# Patient Record
Sex: Male | Born: 1950
Health system: Southern US, Community
[De-identification: ages and names within clinical notes are randomized; demographics above are authoritative.]

## PROBLEM LIST (undated history)

## (undated) DIAGNOSIS — M25561 Pain in right knee: Secondary | ICD-10-CM

## (undated) DIAGNOSIS — H43812 Vitreous degeneration, left eye: Secondary | ICD-10-CM

## (undated) DIAGNOSIS — I499 Cardiac arrhythmia, unspecified: Secondary | ICD-10-CM

## (undated) DIAGNOSIS — L989 Disorder of the skin and subcutaneous tissue, unspecified: Secondary | ICD-10-CM

## (undated) DIAGNOSIS — L602 Onychogryphosis: Secondary | ICD-10-CM

## (undated) DIAGNOSIS — I493 Ventricular premature depolarization: Secondary | ICD-10-CM

## (undated) DIAGNOSIS — H353124 Nonexudative age-related macular degeneration, left eye, advanced atrophic with subfoveal involvement: Secondary | ICD-10-CM

## (undated) DIAGNOSIS — R451 Restlessness and agitation: Secondary | ICD-10-CM

## (undated) DIAGNOSIS — R634 Abnormal weight loss: Secondary | ICD-10-CM

## (undated) HISTORY — PX: DENTAL SURGERY: SHX609

## (undated) HISTORY — DX: Vitreous degeneration, left eye: H43.812

## (undated) HISTORY — PX: FRACTURE SURGERY: SHX138

## (undated) HISTORY — PX: VASECTOMY: SHX75

## (undated) HISTORY — DX: Pain in right knee: M25.561

## (undated) HISTORY — DX: Ventricular premature depolarization: I49.3

## (undated) HISTORY — DX: Cardiac arrhythmia, unspecified: I49.9

## (undated) HISTORY — DX: Disorder of the skin and subcutaneous tissue, unspecified: L98.9

## (undated) HISTORY — DX: Abnormal weight loss: R63.4

## (undated) HISTORY — PX: APPENDECTOMY: SHX54

## (undated) HISTORY — DX: Nonexudative age-related macular degeneration, left eye, advanced atrophic with subfoveal involvement: H35.3124

## (undated) HISTORY — DX: Onychogryphosis: L60.2

## (undated) HISTORY — DX: Restlessness and agitation: R45.1

## (undated) HISTORY — PX: EYE SURGERY: SHX253

## (undated) HISTORY — PX: OTHER SURGICAL HISTORY: SHX169

---

## 2019-03-26 ENCOUNTER — Ambulatory Visit (INDEPENDENT_AMBULATORY_CARE_PROVIDER_SITE_OTHER): Payer: PPO

## 2019-03-26 ENCOUNTER — Encounter (HOSPITAL_COMMUNITY): Payer: Self-pay

## 2019-03-26 ENCOUNTER — Ambulatory Visit (HOSPITAL_COMMUNITY)
Admission: EM | Admit: 2019-03-26 | Discharge: 2019-03-26 | Disposition: A | Discharge status: Home / Self Care | Payer: PPO | Attending: Emergency Medicine | Admitting: Emergency Medicine

## 2019-03-26 ENCOUNTER — Other Ambulatory Visit: Payer: Self-pay

## 2019-03-26 ENCOUNTER — Ambulatory Visit (HOSPITAL_COMMUNITY): Payer: PPO

## 2019-03-26 DIAGNOSIS — W319XXA Contact with unspecified machinery, initial encounter: Secondary | ICD-10-CM | POA: Diagnosis not present

## 2019-03-26 DIAGNOSIS — S92421B Displaced fracture of distal phalanx of right great toe, initial encounter for open fracture: Secondary | ICD-10-CM

## 2019-03-26 DIAGNOSIS — Z23 Encounter for immunization: Secondary | ICD-10-CM

## 2019-03-26 DIAGNOSIS — S92422A Displaced fracture of distal phalanx of left great toe, initial encounter for closed fracture: Secondary | ICD-10-CM | POA: Diagnosis not present

## 2019-03-26 DIAGNOSIS — T148XXA Other injury of unspecified body region, initial encounter: Secondary | ICD-10-CM

## 2019-03-26 MED ORDER — CEPHALEXIN 500 MG PO CAPS: 500.0000 mg | ORAL_CAPSULE | Freq: Four times a day (QID) | ORAL | 0 refills | Status: AC

## 2019-03-26 MED ORDER — TETANUS-DIPHTH-ACELL PERTUSSIS 5-2.5-18.5 LF-MCG/0.5 IM SUSP
0.5000 mL | Freq: Once | INTRAMUSCULAR | Status: AC
  Administered 2019-03-26: 0.5 mL via INTRAMUSCULAR

## 2019-03-26 MED ORDER — TETANUS-DIPHTH-ACELL PERTUSSIS 5-2.5-18.5 LF-MCG/0.5 IM SUSP
INTRAMUSCULAR | Status: AC
  Filled 2019-03-26: qty 0.5

## 2019-03-26 NOTE — ED Provider Notes (Addendum)
[facility]    CSN: [id number] Arrival date & time: 03/26/19  1438     History   Chief Complaint Chief Complaint  Patient presents with  . Toe Pain    HPI Julio AlmJohn E Hynson is a 68 y.o. male no significant past medical history presenting today for evaluation of left toe injury.  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  He was wearing sneakers.  Had some mild initial pain, but this went away quickly.  He continued to work for another 3 to 4 hours.  After this and he took off his shoe he noticed he was bleeding a lot.  He has not had much pain.  Patient denies history of diabetes.  Denies use of any blood thinners. HPI  History reviewed. No pertinent past medical history.  There are no active problems to display for this patient.   History reviewed. No pertinent surgical history.     Home Medications    Prior to Admission medications   Medication Sig Start Date End Date Taking? Authorizing Provider  cephALEXin (KEFLEX) 500 MG capsule Take 1 capsule (500 mg total) by mouth 4 (four) times daily for 7 days. 03/26/19 04/02/19  Mariano Doshi, Junius CreamerHallie C, PA-C    Family History History reviewed. No pertinent family history.  Social History Social History   Tobacco Use  . Smoking status: Former Games developermoker  . Smokeless tobacco: Current User  Substance Use Topics  . Alcohol use: Never    Frequency: Never  . Drug use: Yes     Allergies   Patient has no known allergies.   Review of Systems Review of Systems  Constitutional: Negative for fatigue and fever.  Eyes: Negative for redness, itching and visual disturbance.  Respiratory: Negative for shortness of breath.   Cardiovascular: Negative for chest pain and leg swelling.  Gastrointestinal: Negative for nausea and vomiting.  Musculoskeletal: Positive for joint swelling. Negative for arthralgias and myalgias.  Skin: Positive  for color change and wound. Negative for rash.  Neurological: Negative for dizziness, syncope, weakness, light-headedness and headaches.     Physical Exam Triage Vital Signs ED Triage Vitals  Enc Vitals Group     BP 03/26/19 1615 125/67     Pulse Rate 03/26/19 1615 69     Resp 03/26/19 1615 18     Temp 03/26/19 1615 98.6 F (37 C)     Temp Source 03/26/19 1615 Oral     SpO2 03/26/19 1615 97 %     Weight 03/26/19 1617 225 lb (102.1 kg)     Height --      Head Circumference --      Peak Flow --      Pain Score 03/26/19 1617 1     Pain Loc --      Pain Edu? --      Excl. in GC? --    No data found.  Updated Vital Signs BP 125/67 (BP Location: Right Arm)   Pulse 69   Temp 98.6 F (37 C) (Oral)   Resp 18   Wt 225 lb (102.1 kg)   SpO2 97%   Visual Acuity Right Eye Distance:   Left Eye Distance:   Bilateral Distance:    Right Eye Near:   Left Eye Near:    Bilateral Near:     Physical Exam Vitals signs and nursing note reviewed.  Constitutional:  Appearance: He is well-developed.     Comments: No acute distress; well appearing  HENT:     Head: Normocephalic and atraumatic.     Nose: Nose normal.  Eyes:     Conjunctiva/sclera: Conjunctivae normal.  Neck:     Musculoskeletal: Neck supple.  Cardiovascular:     Rate and Rhythm: Normal rate.  Pulmonary:     Effort: Pulmonary effort is normal. No respiratory distress.  Abdominal:     General: There is no distension.  Musculoskeletal: Normal range of motion.     Comments: Dorsalis pedis 2+, nontender throughout dorsum of foot Ambulating without abnormality  Skin:    General: Skin is warm and dry.     Comments: See pictures below; large area of clotted/soft tissue swelling about distal great left toe, large nail completely removed, but is still attached to some soft tissue below.  The more distal wound edge appears to extend below nailbed slightly  Neurological:     Mental Status: He is alert and oriented  to person, place, and time.          UC Treatments / Results  Labs (all labs ordered are listed, but only abnormal results are displayed) Labs Reviewed - No data to display  EKG   Radiology Dg Foot Complete Left  Result Date: 03/26/2019 CLINICAL DATA:  Trauma to great toe. EXAM: LEFT FOOT - COMPLETE 3+ VIEW COMPARISON:  None. FINDINGS: There is a comminuted displaced fracture through the distal first phalanx. Linear high attenuation along the dorsum of the great toe is only seen on the lateral view. The lack of visualization on the AP or oblique views would suggest this is not a foreign body. Recommend clinical correlation. IMPRESSION: 1. Comminuted displaced fracture through the distal first phalanx. 2. Curvilinear high attenuation along the dorsum of the distal phalanx on only the lateral view is probably a dense bony fragment given the lack of visualization on the other views. A foreign body is considered less likely given lack of visualization on other views. Electronically Signed   By: Dorise Bullion III M.D   On: 03/26/2019 16:47    Procedures Procedures (including critical care time)  Medications Ordered in UC Medications  Tdap (BOOSTRIX) injection 0.5 mL (0.5 mLs Intramuscular Given 03/26/19 1749)  Tdap (BOOSTRIX) 5-2.5-18.5 LF-MCG/0.5 injection (has no administration in time range)  Tdap (BOOSTRIX) 5-2.5-18.5 LF-MCG/0.5 injection (has no administration in time range)  Tdap (BOOSTRIX) 5-2.5-18.5 LF-MCG/0.5 injection (has no administration in time range)    Initial Impression / Assessment and Plan / UC Course  I have reviewed the triage vital signs and the nursing notes.  Pertinent labs & imaging results that were available during my care of the patient were reviewed by me and considered in my medical decision making (see chart for details).     Comminuted fracture of distal first phalanx of great toe on left foot.  Likely open fracture given associated superficial  injury. Discussed fracture with Dr. Murphy-recommended outpatient follow-up, irrigate well, prophylactic antibiotics, advised to keep toe nail intact.  Initially believed to be clot, but appears to have significant soft tissue swelling.  Wound irrigated with 1000 mL of sterile water and syringe.  Applied Xeroform gauze followed by Kerlix and Coban.  Given complexity of fracture, recommended patient to use crutches, patient declined.  Advised to keep foot elevated, stay off foot as much as possible and avoid closed toe shoes.  Tetanus updated today.  Placed on Keflex.  Provided contact for follow-up  with Delbert HarnessMurphy Wainer.Discussed strict return precautions. Patient verbalized understanding and is agreeable with plan.  Final Clinical Impressions(s) / UC Diagnoses   Final diagnoses:  Comminuted fracture  Open displaced fracture of distal phalanx of right great toe, initial encounter     Discharge Instructions     Please follow up with Dr Eulah Pontmurphy this week, call Monday morning We updated your tetanus Please begin keflex 4 times daily for 5 days    ED Prescriptions    Medication Sig Dispense Auth. Provider   cephALEXin (KEFLEX) 500 MG capsule Take 1 capsule (500 mg total) by mouth 4 (four) times daily for 7 days. 28 capsule Chrisotpher Rivero C, PA-C     Controlled Substance Prescriptions [place] Controlled Substance Registry consulted? Not Applicable   Lew DawesWieters, Jomari Bartnik C, PA-C 03/27/19 0920    Lew DawesWieters, Harry Shuck C, PA-C 03/27/19 [phone]

## 2019-03-26 NOTE — ED Triage Notes (Signed)
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 This happened this morning.

## 2019-03-26 NOTE — Discharge Instructions (Signed)
Please follow up with Dr Percell Miller this week, call Monday morning We updated your tetanus Please begin keflex 4 times daily for 5 days

## 2020-03-26 DIAGNOSIS — H33301 Unspecified retinal break, right eye: Secondary | ICD-10-CM | POA: Diagnosis not present

## 2020-03-26 DIAGNOSIS — H2513 Age-related nuclear cataract, bilateral: Secondary | ICD-10-CM | POA: Diagnosis not present

## 2020-03-26 DIAGNOSIS — H353213 Exudative age-related macular degeneration, right eye, with inactive scar: Secondary | ICD-10-CM | POA: Diagnosis not present

## 2020-03-26 DIAGNOSIS — H52203 Unspecified astigmatism, bilateral: Secondary | ICD-10-CM | POA: Diagnosis not present

## 2020-04-06 DIAGNOSIS — H43811 Vitreous degeneration, right eye: Secondary | ICD-10-CM | POA: Diagnosis not present

## 2020-04-06 DIAGNOSIS — H353123 Nonexudative age-related macular degeneration, left eye, advanced atrophic without subfoveal involvement: Secondary | ICD-10-CM | POA: Diagnosis not present

## 2020-04-06 DIAGNOSIS — H353213 Exudative age-related macular degeneration, right eye, with inactive scar: Secondary | ICD-10-CM | POA: Diagnosis not present

## 2020-04-06 DIAGNOSIS — H59811 Chorioretinal scars after surgery for detachment, right eye: Secondary | ICD-10-CM | POA: Diagnosis not present

## 2020-10-16 DIAGNOSIS — H43391 Other vitreous opacities, right eye: Secondary | ICD-10-CM | POA: Diagnosis not present

## 2020-10-16 DIAGNOSIS — H353124 Nonexudative age-related macular degeneration, left eye, advanced atrophic with subfoveal involvement: Secondary | ICD-10-CM | POA: Diagnosis not present

## 2020-10-16 DIAGNOSIS — H43811 Vitreous degeneration, right eye: Secondary | ICD-10-CM | POA: Diagnosis not present

## 2020-10-16 DIAGNOSIS — H353213 Exudative age-related macular degeneration, right eye, with inactive scar: Secondary | ICD-10-CM | POA: Diagnosis not present

## 2021-02-05 DIAGNOSIS — H353124 Nonexudative age-related macular degeneration, left eye, advanced atrophic with subfoveal involvement: Secondary | ICD-10-CM | POA: Diagnosis not present

## 2021-02-05 DIAGNOSIS — H353213 Exudative age-related macular degeneration, right eye, with inactive scar: Secondary | ICD-10-CM | POA: Diagnosis not present

## 2021-03-07 IMAGING — DX LEFT FOOT - COMPLETE 3+ VIEW
3 series · 3 of 3 positions shown · non-contrast
Comparison: None.

CLINICAL DATA: Trauma to great toe.

EXAM:
LEFT FOOT - COMPLETE 3+ VIEW

[foot ap]
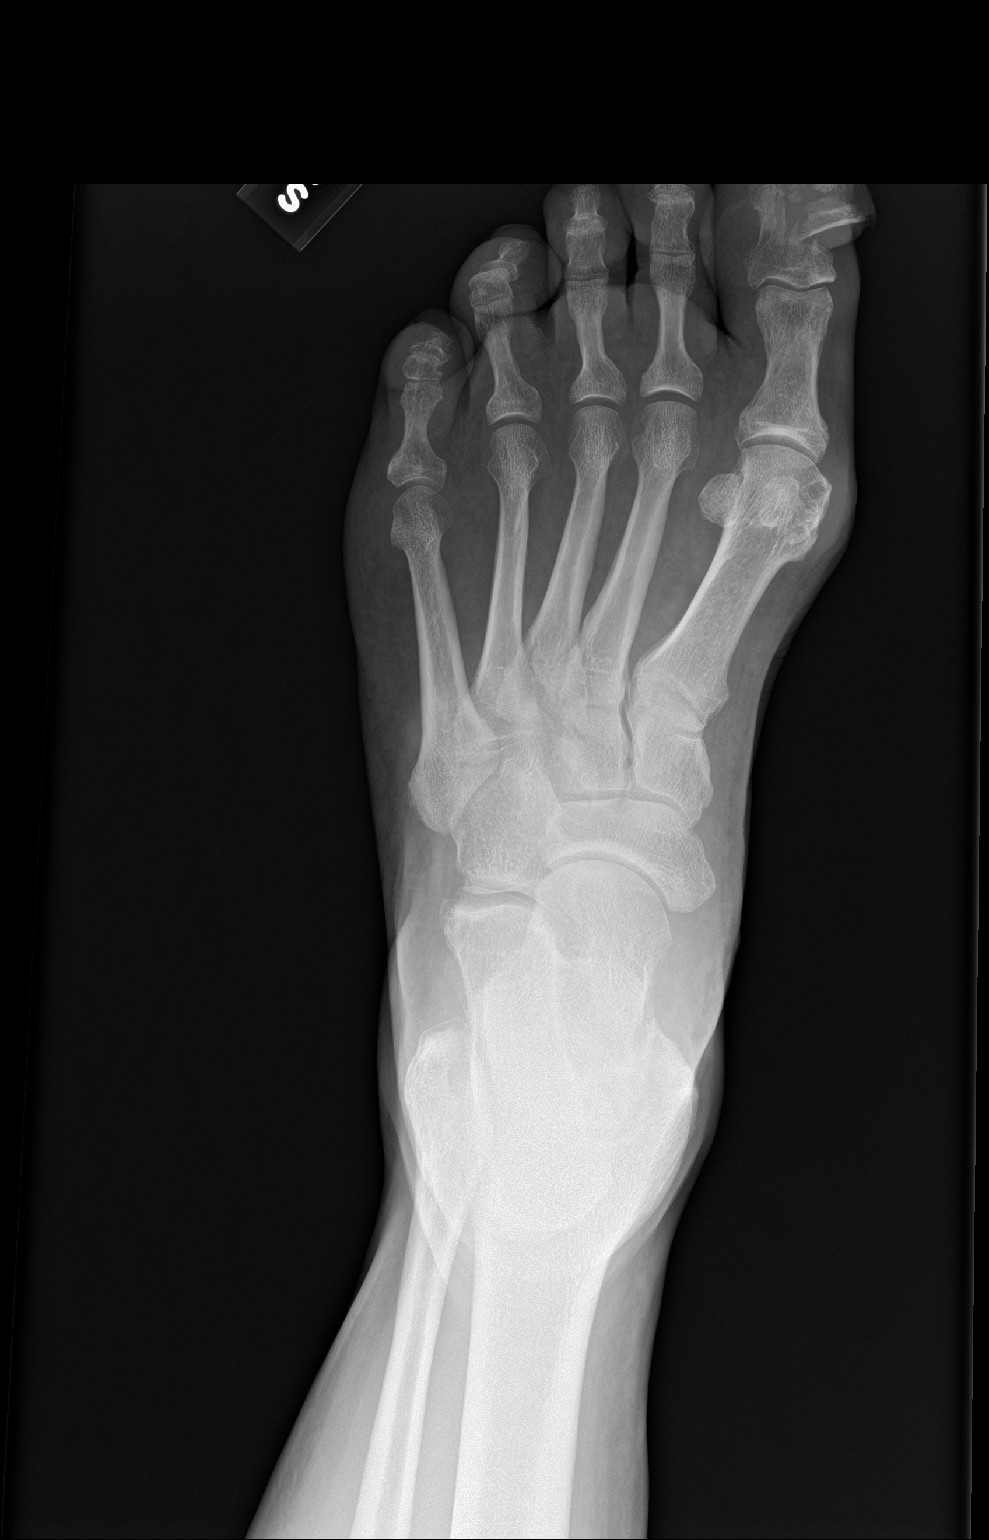

[foot obl]
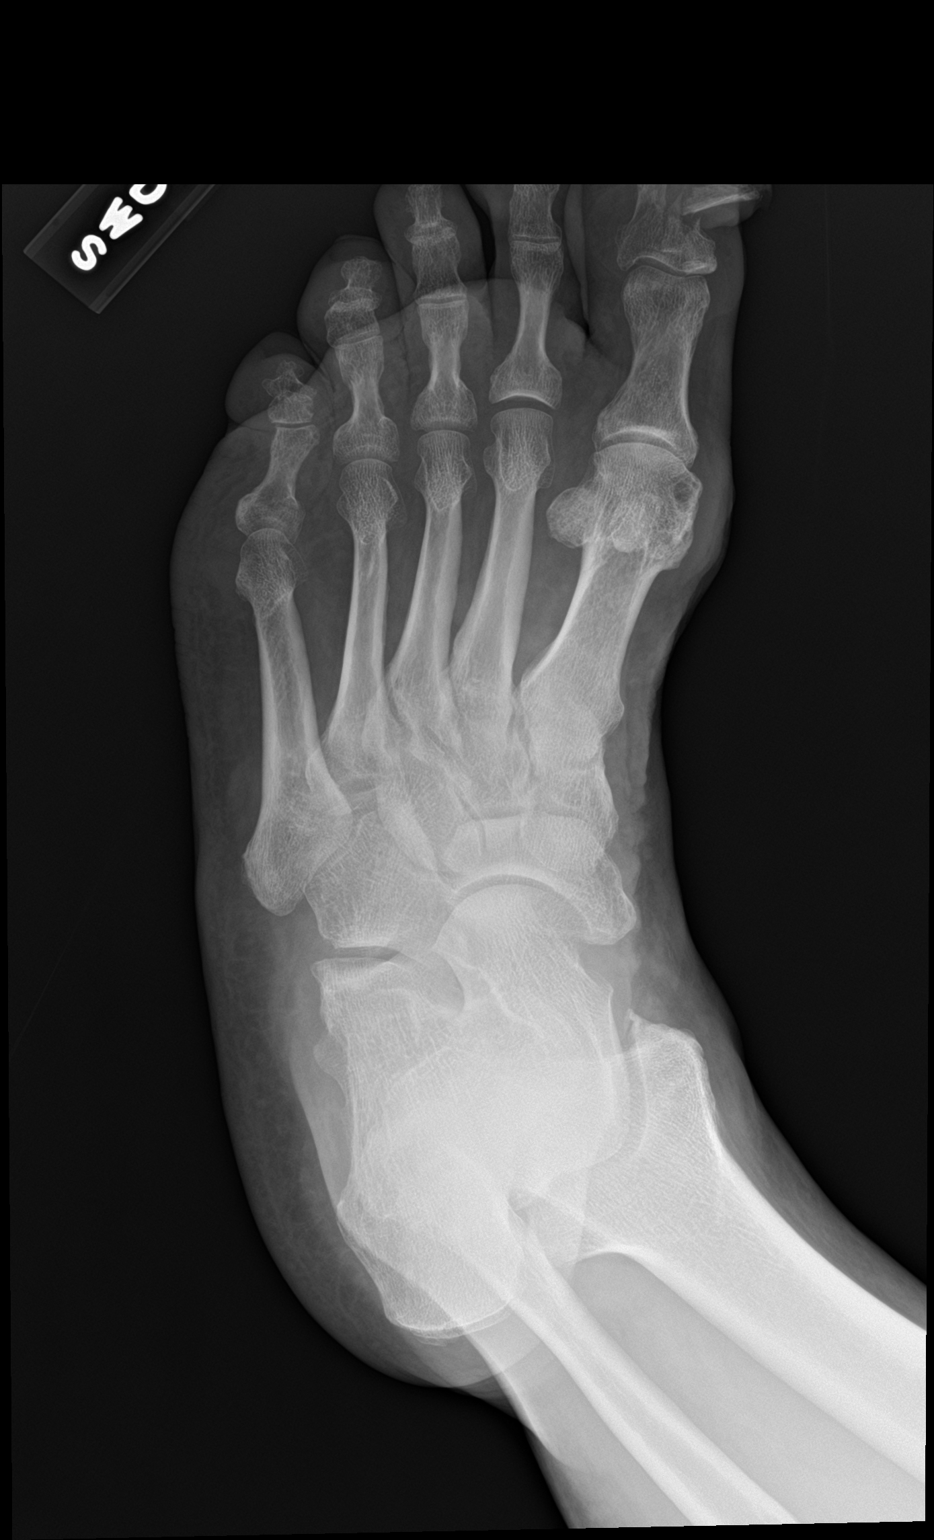

[foot lat]
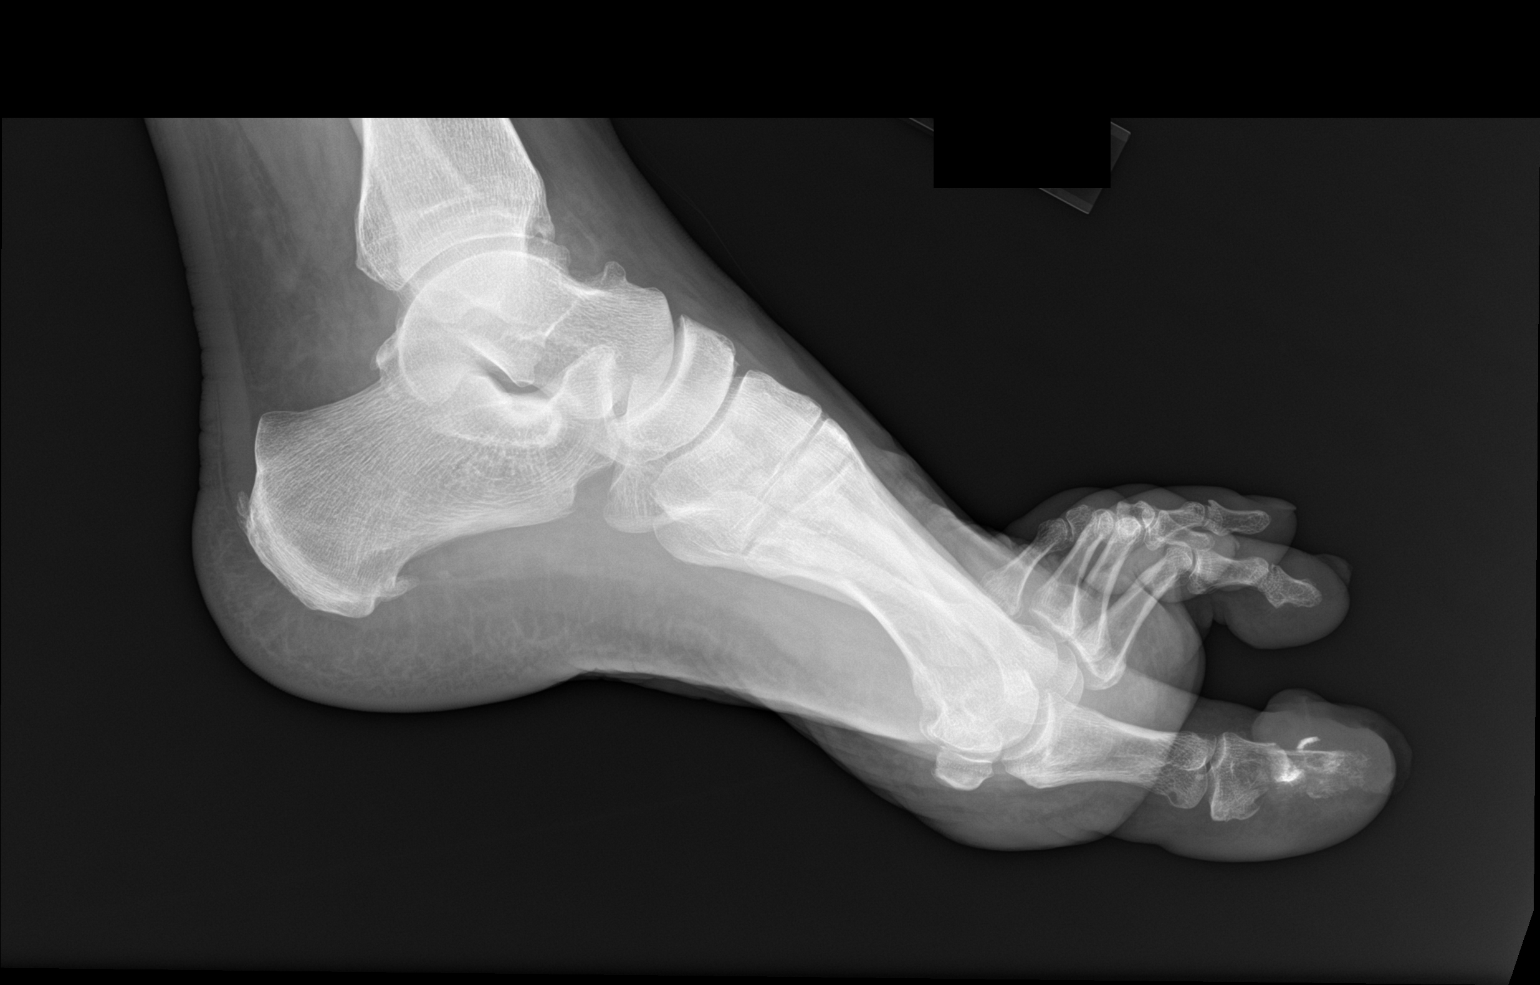

[3 of 3 positions shown; findings below may reference images not displayed]

FINDINGS: There is a comminuted displaced fracture through the distal first
phalanx. Linear high attenuation along the dorsum of the great toe
is only seen on the lateral view. The lack of visualization on the
AP or oblique views would suggest this is not a foreign body.
Recommend clinical correlation.
IMPRESSION: 1. Comminuted displaced fracture through the distal first phalanx.
2. Curvilinear high attenuation along the dorsum of the distal
phalanx on only the lateral view is probably a dense bony fragment
given the lack of visualization on the other views. A foreign body
is considered less likely given lack of visualization on other
views.

## 2021-05-07 DIAGNOSIS — H43812 Vitreous degeneration, left eye: Secondary | ICD-10-CM | POA: Diagnosis not present

## 2021-05-07 DIAGNOSIS — H353213 Exudative age-related macular degeneration, right eye, with inactive scar: Secondary | ICD-10-CM | POA: Diagnosis not present

## 2021-05-07 DIAGNOSIS — H353124 Nonexudative age-related macular degeneration, left eye, advanced atrophic with subfoveal involvement: Secondary | ICD-10-CM | POA: Diagnosis not present

## 2023-05-25 ENCOUNTER — Ambulatory Visit: Payer: PPO | Admitting: Nurse Practitioner

## 2023-05-26 ENCOUNTER — Encounter: Payer: Self-pay | Admitting: Family

## 2023-05-26 ENCOUNTER — Ambulatory Visit (INDEPENDENT_AMBULATORY_CARE_PROVIDER_SITE_OTHER): Payer: PPO | Admitting: Family

## 2023-05-26 ENCOUNTER — Telehealth: Payer: Self-pay | Admitting: Family

## 2023-05-26 VITALS — BP 118/68 | HR 66 | Temp 97.7°F | Ht 71.0 in | Wt 193.6 lb

## 2023-05-26 DIAGNOSIS — X509XXA Other and unspecified overexertion or strenuous movements or postures, initial encounter: Secondary | ICD-10-CM

## 2023-05-26 DIAGNOSIS — R634 Abnormal weight loss: Secondary | ICD-10-CM | POA: Diagnosis not present

## 2023-05-26 DIAGNOSIS — Z23 Encounter for immunization: Secondary | ICD-10-CM | POA: Diagnosis not present

## 2023-05-26 DIAGNOSIS — Z1211 Encounter for screening for malignant neoplasm of colon: Secondary | ICD-10-CM | POA: Diagnosis not present

## 2023-05-26 DIAGNOSIS — H353213 Exudative age-related macular degeneration, right eye, with inactive scar: Secondary | ICD-10-CM | POA: Diagnosis not present

## 2023-05-26 DIAGNOSIS — Z720 Tobacco use: Secondary | ICD-10-CM

## 2023-05-26 DIAGNOSIS — I493 Ventricular premature depolarization: Secondary | ICD-10-CM

## 2023-05-26 DIAGNOSIS — H353124 Nonexudative age-related macular degeneration, left eye, advanced atrophic with subfoveal involvement: Secondary | ICD-10-CM | POA: Diagnosis not present

## 2023-05-26 DIAGNOSIS — Z136 Encounter for screening for cardiovascular disorders: Secondary | ICD-10-CM | POA: Diagnosis not present

## 2023-05-26 DIAGNOSIS — Z0000 Encounter for general adult medical examination without abnormal findings: Secondary | ICD-10-CM | POA: Diagnosis not present

## 2023-05-26 DIAGNOSIS — H43812 Vitreous degeneration, left eye: Secondary | ICD-10-CM | POA: Diagnosis not present

## 2023-05-26 DIAGNOSIS — Z0001 Encounter for general adult medical examination with abnormal findings: Secondary | ICD-10-CM

## 2023-05-26 DIAGNOSIS — I499 Cardiac arrhythmia, unspecified: Secondary | ICD-10-CM | POA: Diagnosis not present

## 2023-05-26 DIAGNOSIS — Z1231 Encounter for screening mammogram for malignant neoplasm of breast: Secondary | ICD-10-CM

## 2023-05-26 DIAGNOSIS — R5383 Other fatigue: Secondary | ICD-10-CM | POA: Diagnosis not present

## 2023-05-26 DIAGNOSIS — R451 Restlessness and agitation: Secondary | ICD-10-CM

## 2023-05-26 LAB — LIPID PANEL
Cholesterol: 205 mg/dL — ABNORMAL HIGH (ref 0–200)
HDL: 59.7 mg/dL (ref >39.00)
LDL Cholesterol: 124 mg/dL — ABNORMAL HIGH (ref 0–99)
NonHDL: 144.84
Total CHOL/HDL Ratio: 3
Triglycerides: 102 mg/dL (ref 0.0–149.0)
VLDL: 20.4 mg/dL (ref 0.0–40.0)

## 2023-05-26 LAB — BASIC METABOLIC PANEL
BUN: 18 mg/dL (ref 6–23)
CO2: 30 meq/L (ref 19–32)
Calcium: 9.7 mg/dL (ref 8.4–10.5)
Chloride: 104 meq/L (ref 96–112)
Creatinine, Ser: 0.75 mg/dL (ref 0.40–1.50)
GFR: 91.24 mL/min (ref >60.00)
Glucose, Bld: 95 mg/dL (ref 70–99)
Potassium: 4.2 meq/L (ref 3.5–5.1)
Sodium: 140 meq/L (ref 135–145)

## 2023-05-26 LAB — T4, FREE: Free T4: 0.85 ng/dL (ref 0.60–1.60)

## 2023-05-26 LAB — CBC WITH DIFFERENTIAL/PLATELET
Basophils Absolute: 0.1 10*3/uL (ref 0.0–0.1)
Basophils Relative: 1.2 % (ref 0.0–3.0)
Eosinophils Absolute: 0.1 10*3/uL (ref 0.0–0.7)
Eosinophils Relative: 2 % (ref 0.0–5.0)
HCT: 40.9 % (ref 39.0–52.0)
Hemoglobin: 13.5 g/dL (ref 13.0–17.0)
Lymphocytes Relative: 28 % (ref 12.0–46.0)
Lymphs Abs: 2 10*3/uL (ref 0.7–4.0)
MCHC: 33 g/dL (ref 30.0–36.0)
MCV: 86.5 fl (ref 78.0–100.0)
Monocytes Absolute: 0.5 10*3/uL (ref 0.1–1.0)
Monocytes Relative: 7.4 % (ref 3.0–12.0)
Neutro Abs: 4.4 10*3/uL (ref 1.4–7.7)
Neutrophils Relative %: 61.4 % (ref 43.0–77.0)
Platelets: 303 10*3/uL (ref 150.0–400.0)
RBC: 4.73 Mil/uL (ref 4.22–5.81)
RDW: 14 % (ref 11.5–15.5)
WBC: 7.1 10*3/uL (ref 4.0–10.5)

## 2023-05-26 LAB — T3, FREE: T3, Free: 3.4 pg/mL (ref 2.3–4.2)

## 2023-05-26 LAB — VITAMIN B12: Vitamin B-12: 214 pg/mL (ref 211–911)

## 2023-05-26 LAB — TSH: TSH: 1.48 u[IU]/mL (ref 0.35–5.50)

## 2023-05-26 NOTE — Assessment & Plan Note (Addendum)
Patient Counseling(The following topics were reviewed):  Preventative care handout given to pt  Health maintenance and immunizations reviewed. Please refer to Health maintenance section. Pt advised on safe sex, wearing seatbelts in car, and proper nutrition labwork ordered today for annual Dental health: Discussed importance of regular tooth brushing, flossing, and dental visits.  Flu vaccine and prevnar 20 given in office.  Cologuard order, pt declines colonoscopy referral.  U/s AAA ordered pending scheduling for tobacco smoking history   Total time in clinic 70 minutes with review of historical records, obtaining history as well as reviewing notes, ordering and reviewing EKG and listening to concerns.

## 2023-05-26 NOTE — Assessment & Plan Note (Signed)
Seen on EKG.  Pt currently asymptomatic.  Referral placed for cardiology.

## 2023-05-26 NOTE — Assessment & Plan Note (Addendum)
Increased restlessness in the last six months or so  Ordering thyroid panel ddx hyperthyroid, hypomania  Labs pending then consider referral to psychiatry if lab work up negative.

## 2023-05-26 NOTE — Assessment & Plan Note (Signed)
Overdue for f/u with ophthalmology. Advised pt to make appt for f/u

## 2023-05-26 NOTE — Assessment & Plan Note (Signed)
Referral to lung cancer screening program made. Smoking cessation instruction/counseling given:  counseled patient on the dangers of tobacco use, advised patient to stop smoking, and reviewed strategies to maximize success

## 2023-05-26 NOTE — Assessment & Plan Note (Signed)
Increased restlessness in the last six months or so  Ordering thyroid panel ddx hyperthyroid, hypomania  Labs pending then consider referral to psychiatry if lab work up negative.

## 2023-05-26 NOTE — Patient Instructions (Addendum)
  You have to get the shingles vaccination at the pharmacy   I have ordered a cologuard this should come directly to your home to get started on .   Stop by the lab prior to leaving today. I will notify you of your results once received.   Ultrasound of the abdomen has been ordered at [facility]. If you do not receive a call in the next 1-2 weeks let me know. This is screening for abdominal aortic aneurysm.   A referral was placed today for cardiology Please let us know if you have not heard back within 2 weeks about the referral.

## 2023-05-26 NOTE — Progress Notes (Signed)
Subjective:  Patient ID: Isaiah Williams, male    DOB: 1951/06/25  Age: 72 y.o. MRN: [id number]  Patient Care Team: Mort Sawyers, FNP as PCP - General (Family Medicine)   CC:  Chief Complaint  Patient presents with   Establish Care    HPI Isaiah Williams is a 72 y.o. male He is here today to establish care as a new patient as well as here for an annual exam accompanied by his brother. Has not a primary care provider in 25 years.   He reports consuming a general diet.  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  He generally feels well. He reports sleeping well. He does have additional problems to discuss today.   Oriented to practice routines and expectations. He is very active, eating 3000 to 5000 calories a day.   Pt is with acute concerns.   Brushing injury left bit toe, he states that he has his toe nail growing straight upwards. He tried to use a dremel to file it down. He had farm machinery land on it and almost cut his toe off back when it occurred.   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 Has worked on various projects, almost seemingly endless projects. Pt does report about a 30 pound weight loss in a six month period of time. Pt does report he sleeps about 5-8 hours a night. He will take a 2-3 hour nap in the mid day. Pt is very task oriented he denies any racing thoughts . He states that he had a period of laziness about six months ago, but then had a sudden change in his energy and started becoming more restless and energetic. He states this is because he had a change mentally where he wanted to change being so lazy. He has since started making lists for his activities, prior to this he would go off the top of his head. 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   Open to getting flu and pneumonia vaccine.   chronic concerns:  Bil knee injections:  decided to take a salt supplement and hyaluronic acid over the counter on his own accord.   Macular degeneration: uses reading glasses. Goes every two to three years. He is overdue for f/u.   Wt Readings from Last 3 Encounters:  05/26/23 193 lb 9.6 oz (87.8 kg)  03/26/19 225 lb (102.1 kg)    Vision:Not within last year Dental:No regular dental care  Lung Cancer Screening with low-dose Chest CT: > 20 year nicotine  - Adults age 28-80 who are current cigarette smokers or quit within the last 15 years. Must have 20 pack year history.  AAA Screening: ordered - Men age 56-75 who have ever smoked  Colonoscopy:never had however open to cologuard.    Advanced Directives Patient does have advanced directives he brought it into the office today to get scanned into the chart.   DEPRESSION SCREENING    05/26/2023   10:25 AM  PHQ 2/9 Scores  PHQ - 2 Score 3  PHQ- 9 Score 3     ROS: Negative unless specifically indicated above in HPI.    Current Outpatient Medications:    Multiple Vitamins-Minerals (EYE VITAMINS PO), Take 2 tablets by mouth daily., Disp: , Rfl:    Omega 3 1000 MG CAPS, Take 2 capsules by mouth in the  morning and at bedtime., Disp: , Rfl:    Sodium Hyaluronate, oral, (HYALURONIC ACID) 100 MG CAPS, Take 1 capsule by mouth in the morning and at bedtime., Disp: , Rfl:     Objective:    BP 118/68 (BP Location: Right Arm, Patient Position: Sitting, Cuff Size: Normal)   Pulse 66   Temp 97.7 F (36.5 C) (Temporal)   Ht 5\' 11"  (1.803 m)   Wt 193 lb 9.6 oz (87.8 kg)   SpO2 97%   BMI 27.00 kg/m   BP Readings from Last 3 Encounters:  05/26/23 118/68  03/26/19 125/67      Physical Exam Vitals reviewed.  Constitutional:      General: He is not in acute distress.    Appearance: Normal appearance. He is normal weight. He is not ill-appearing, toxic-appearing or diaphoretic.  HENT:     Head: Normocephalic.     Right Ear: Tympanic membrane normal.     Left Ear:  Tympanic membrane normal.     Nose: Nose normal.     Mouth/Throat:     Mouth: Mucous membranes are moist.  Eyes:     Pupils: Pupils are equal, round, and reactive to light.  Cardiovascular:     Rate and Rhythm: Normal rate. Rhythm irregularly irregular.     Pulses:          Carotid pulses are 2+ on the right side and 2+ on the left side.      Radial pulses are 2+ on the right side and 2+ on the left side.       Femoral pulses are 2+ on the right side and 2+ on the left side.      Popliteal pulses are 2+ on the right side and 2+ on the left side.       Dorsalis pedis pulses are 2+ on the right side and 2+ on the left side.       Posterior tibial pulses are 2+ on the right side and 2+ on the left side.  Pulmonary:     Effort: Pulmonary effort is normal.     Breath sounds: Normal breath sounds.  Abdominal:     General: Abdomen is flat.     Tenderness: There is no abdominal tenderness.  Musculoskeletal:        General: Normal range of motion.     Right lower leg: 1+ Edema present.     Left lower leg: 1+ Edema present.  Feet:     Right foot:     Skin integrity: Callus present.     Toenail Condition: Right toenails are abnormally thick and long. Fungal disease present. Skin:    General: Skin is warm.  Neurological:     General: No focal deficit present.     Mental Status: He is alert and oriented to person, place, and time. Mental status is at baseline.  Psychiatric:        Mood and Affect: Mood normal.        Behavior: Behavior normal.        Thought Content: Thought content normal.        Judgment: Judgment normal.          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   Orders: -     TSH -     T4, free -     T3, free -     Basic metabolic panel  Screening mammogram for breast cancer  Screening for colon cancer -     Cologuard  Tobacco abuse Assessment & Plan: Referral to lung cancer screening program made. Smoking cessation instruction/counseling given:  counseled patient on the dangers of tobacco use, advised patient to stop smoking, and reviewed strategies to maximize success   Orders: -     Ambulatory Referral for Lung Cancer Scre -     [place] [employer] SCREENING; Future  Encounter for immunization -     Flu Vaccine Trivalent High Dose (Fluad) -     Pneumococcal conjugate vaccine 20-valent  Ventricular ectopic beat Assessment & Plan: Seen on EKG.  Pt currently asymptomatic.  Referral placed for cardiology.  Orders: -     Ambulatory referral to Cardiology  Irregular heart rhythm Assessment & Plan: EKG in office, reviewed.  Ventricular ectopic beats.  RBBB  Referral placed for cardiology.  asymptomatic  Orders: -     EKG 12-Lead  Restlessness Assessment & Plan: Increased restlessness in the last six months or so  Ordering thyroid panel ddx hyperthyroid, hypomania  Labs pending then consider referral to psychiatry if lab work up negative.    Encounter for general adult medical examination with abnormal findings Assessment & Plan: Patient Counseling(The following topics were reviewed):  Preventative care handout given to pt  Health maintenance and immunizations reviewed. Please  refer to Health maintenance section. Pt advised on safe sex, wearing seatbelts in car, and proper nutrition labwork ordered today for annual Dental health: Discussed importance of regular tooth brushing, flossing, and dental visits.  Flu vaccine and prevnar 20 given in office.  Cologuard order, pt declines colonoscopy referral.  U/s AAA ordered pending scheduling for tobacco smoking history   Total time in clinic 70 minutes with review of historical records, obtaining history as well as reviewing notes, ordering and reviewing EKG and listening to concerns.         Follow-up: Return in about 2 weeks (around 06/09/2023) for f/u concerns .   Mort Sawyers, FNP

## 2023-05-26 NOTE — Assessment & Plan Note (Addendum)
EKG in office, reviewed.  Ventricular ectopic beats.  RBBB  Referral placed for cardiology.  asymptomatic

## 2023-05-26 NOTE — Telephone Encounter (Signed)
Mort Sawyers, FNP  P Dugal Pool Can we call pt to get him scheduled for 2-3 week f/u in office to discuss with him findings ---------------------------- Spoke with pt. He has been scheduled for 06/16/2023 at 0940. Nothing further was needed.

## 2023-05-27 ENCOUNTER — Encounter: Payer: Self-pay | Admitting: *Deleted

## 2023-06-11 LAB — COLOGUARD: COLOGUARD: NEGATIVE

## 2023-06-16 ENCOUNTER — Encounter: Payer: Self-pay | Admitting: Family

## 2023-06-16 ENCOUNTER — Ambulatory Visit (INDEPENDENT_AMBULATORY_CARE_PROVIDER_SITE_OTHER): Payer: PPO | Admitting: Family

## 2023-06-16 VITALS — BP 120/72 | HR 76 | Temp 97.3°F | Ht 71.0 in | Wt 196.2 lb

## 2023-06-16 DIAGNOSIS — R634 Abnormal weight loss: Secondary | ICD-10-CM

## 2023-06-16 DIAGNOSIS — L989 Disorder of the skin and subcutaneous tissue, unspecified: Secondary | ICD-10-CM

## 2023-06-16 DIAGNOSIS — I493 Ventricular premature depolarization: Secondary | ICD-10-CM

## 2023-06-16 DIAGNOSIS — M25561 Pain in right knee: Secondary | ICD-10-CM | POA: Diagnosis not present

## 2023-06-16 DIAGNOSIS — L602 Onychogryphosis: Secondary | ICD-10-CM

## 2023-06-16 DIAGNOSIS — R451 Restlessness and agitation: Secondary | ICD-10-CM

## 2023-06-16 NOTE — Patient Instructions (Signed)
------------------------------------   For your Ultrasound [employer] SCREENING .   They have two locations in [place] and one in [place].     [address] [facility])  [address] ([facility])  [address] [facility])   They can be reached at [phone]  ------------------------------------

## 2023-06-16 NOTE — Assessment & Plan Note (Signed)
Again discussed with patient in regards to insomnia and at times racing thoughts the possibility of psychiatric component.  Lab work has been unremarkable.  Brother accompanying patient today and he states that patient has improved slightly with his hyperactivity with the change of weather.  Did advise patient that if he notices any spike in his hyperactivity and/or experiences more depressive episodes to please notify me and we will consider psychiatry consult.  Ddx mood disorder and/or ADD.

## 2023-06-16 NOTE — Assessment & Plan Note (Signed)
Suspected seborrheic keratosis patient declines cryotherapy in office.  Did place referral for dermatology.

## 2023-06-16 NOTE — Assessment & Plan Note (Signed)
EKG in office and arrhythmia noted on auscultation warranted referral to cardiology. Denies cp palp and or sob.

## 2023-06-16 NOTE — Assessment & Plan Note (Signed)
Improving, suspect arthritic.  Advised patient when asked up can elevate use ice and/or heat and apply Voltaren gel as needed.  Advised to wear supportive brace as well when causing pain for support.

## 2023-06-16 NOTE — Progress Notes (Signed)
Established Patient Office Visit  Subjective:      CC:  Chief Complaint  Patient presents with   Medical Management of Chronic Issues    HPI: Isaiah Williams is a 72 y.o. male presenting on 06/16/2023 for Medical Management of Chronic Issues . Sleep: he states he goes to bed around 10-11 pm , takes about five minutes to go to sleep. At times he will sleep 4-5 hours and then get up because he 'is ready to go'.   Screening u/s aorta ordered last visit  Message sent via mychart to get scheduled. Pt has not yet scheduled.   HLD: has cut down a bit on his cholesterol trying to eat some more fish, and trying ot increase vegetables.  Lab Results  Component Value Date   CHOL 205 (H) 05/26/2023   HDL 59.70 05/26/2023   LDLCALC 124 (H) 05/26/2023   TRIG 102.0 05/26/2023   CHOLHDL 3 05/26/2023   B12 def: started taking otc b12 taking liquid solution. Feels some improvement in fatigue.   Ventricular ectopic beat, pt sent to cardiology for further evaluation. He has a scheduled consult appt with cardiology Dr. Jacinto Halim on 06/30/23.   Wt Readings from Last 3 Encounters:  06/16/23 196 lb 3.2 oz (89 kg)  05/26/23 193 lb 9.6 oz (87.8 kg)  03/26/19 225 lb (102.1 kg)   Temp Readings from Last 3 Encounters:  06/16/23 (!) 97.3 F (36.3 C) (Temporal)  05/26/23 97.7 F (36.5 C) (Temporal)  03/26/19 98.6 F (37 C) (Oral)   BP Readings from Last 3 Encounters:  06/16/23 120/72  05/26/23 118/68  03/26/19 125/67   Pulse Readings from Last 3 Encounters:  06/16/23 76  05/26/23 66  03/26/19 69   Cologuard 9/22 negative.   As far as running thoughts and sudden spike in energy ,pt has considered psychiatric concerns, however he states he feels this has to do with age changes and a sudden change in how he was feeling about being more active and productive.   Wt Readings from Last 3 Encounters:  06/16/23 196 lb 3.2 oz (89 kg)  05/26/23 193 lb 9.6 oz (87.8 kg)  03/26/19 225 lb (102.1 kg)    Concern over weight loss, pt states has been eating more and he has gained three pounds. For workup labs unremarkable and cologuard negative.      Social history:  Relevant past medical, surgical, family and social history reviewed and updated as indicated. Interim medical history since our last visit reviewed.  Allergies and medications reviewed and updated.  DATA REVIEWED: CHART IN EPIC     ROS: Negative unless specifically indicated above in HPI.    Current Outpatient Medications:    Multiple Vitamins-Minerals (EYE VITAMINS PO), Take 2 tablets by mouth daily., Disp: , Rfl:    Omega 3 1000 MG CAPS, Take 2 capsules by mouth in the morning and at bedtime., Disp: , Rfl:    Sodium Hyaluronate, oral, (HYALURONIC ACID) 100 MG CAPS, Take 1 capsule by mouth in the morning and at bedtime., Disp: , Rfl:       Objective:    BP 120/72 (BP Location: Left Arm, Patient Position: Sitting, Cuff Size: Normal)   Pulse 76   Temp (!) 97.3 F (36.3 C) (Temporal)   Ht 5\' 11"  (1.803 m)   Wt 196 lb 3.2 oz (89 kg)   SpO2 97%   BMI 27.36 kg/m   Wt Readings from Last 3 Encounters:  06/16/23 196 lb 3.2 oz (89  kg)  05/26/23 193 lb 9.6 oz (87.8 kg)  03/26/19 225 lb (102.1 kg)    Physical Exam Constitutional:      General: He is not in acute distress.    Appearance: Normal appearance. He is normal weight. He is not ill-appearing, toxic-appearing or diaphoretic.  Cardiovascular:     Rate and Rhythm: Normal rate. Rhythm irregularly irregular.  Pulmonary:     Effort: Pulmonary effort is normal.     Breath sounds: Normal breath sounds.  Musculoskeletal:        General: Normal range of motion.  Skin:    Findings: Lesion present.     Comments: Raised scaly brown pigmented lesion with scaly texture on right medial wrist.  Raised scaly textured lesion as well on right medial shin.   Neurological:     General: No focal deficit present.     Mental Status: He is alert and oriented to person,  place, and time. Mental status is at baseline.  Psychiatric:        Attention and Perception: Attention and perception normal.        Mood and Affect: Mood normal.        Behavior: Behavior is hyperactive.        Thought Content: Thought content normal.        Cognition and Memory: Cognition and memory normal.        Judgment: Judgment normal.             Assessment & Plan:  Ventricular ectopic beat Assessment & Plan: EKG in office and arrhythmia noted on auscultation warranted referral to cardiology. Denies cp palp and or sob.    Skin lesions Assessment & Plan: Suspected seborrheic keratosis patient declines cryotherapy in office.  Did place referral for dermatology.  Orders: -     Ambulatory referral to Dermatology  Acute pain of right knee Assessment & Plan: Improving, suspect arthritic.  Advised patient when asked up can elevate use ice and/or heat and apply Voltaren gel as needed.  Advised to wear supportive brace as well when causing pain for support.   Long toenail -     Ambulatory referral to Podiatry  Abnormal weight loss Assessment & Plan: Workup to include lab work as well as Cologuard testing came back unremarkable.  Patient has gained weight since last visit and states that he is starting to eat a little bit more.  Hopefully will continue to gain more weight but advised patient if he starts to lose again to please let me know.  Suspect this might be due to hyperactivity.   Restlessness Assessment & Plan: Again discussed with patient in regards to insomnia and at times racing thoughts the possibility of psychiatric component.  Lab work has been unremarkable.  Brother accompanying patient today and he states that patient has improved slightly with his hyperactivity with the change of weather.  Did advise patient that if he notices any spike in his hyperactivity and/or experiences more depressive episodes to please notify me and we will consider psychiatry  consult.  Ddx mood disorder and/or ADD.      Return in about 6 months (around 12/15/2023) for f/u cholesterol.  Mort Sawyers, MSN, APRN, FNP-C [facility]

## 2023-06-16 NOTE — Assessment & Plan Note (Signed)
Workup to include lab work as well as Cologuard testing came back unremarkable.  Patient has gained weight since last visit and states that he is starting to eat a little bit more.  Hopefully will continue to gain more weight but advised patient if he starts to lose again to please let me know.  Suspect this might be due to hyperactivity.

## 2023-06-18 ENCOUNTER — Encounter: Payer: Self-pay | Admitting: *Deleted

## 2023-06-22 ENCOUNTER — Other Ambulatory Visit: Payer: Self-pay | Admitting: *Deleted

## 2023-06-22 ENCOUNTER — Ambulatory Visit: Payer: PPO | Admitting: Podiatry

## 2023-06-22 DIAGNOSIS — Z122 Encounter for screening for malignant neoplasm of respiratory organs: Secondary | ICD-10-CM

## 2023-06-22 DIAGNOSIS — Z87891 Personal history of nicotine dependence: Secondary | ICD-10-CM

## 2023-06-29 NOTE — Progress Notes (Unsigned)
Cardiology Office Note:  .   Date:  06/30/2023  ID:  Isaiah Williams, DOB 05-Jun-1951, MRN [id number] PCP: Mort Sawyers, FNP  [facility] Providers Cardiologist:  None    History of Present Illness: .   Isaiah Williams is a 72 y.o. Caucasian male patient with no significant prior cardiovascular history referred to [place] for evaluation of abnormal physical exam consistent with irregular heart rhythm.  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  He is very active and recently has had abnormal weight loss and established with Mort Sawyers, FNP on 05/26/2023 after not having seen any physicians in >25 years.  Discussed the use of AI scribe software for clinical note transcription with the patient, who gave verbal consent to proceed.  History of Present Illness   The patient, who has not seen a physician in over two decades, presents for a routine check-up. He lives alone and is self-sufficient, performing all household chores and cooking. He has a history of vaping, equivalent to a pack of cigarettes a day, and has no intention of quitting despite acknowledging the associated health risks. He has a balanced diet, with a mix of red meat, fish, chicken, and vegetables, but admits to a high salt intake. He has been very active in the past six months, engaging in strenuous activities such as climbing [employer] work. During this period, he lost approximately 40 pounds through exercise and dietary changes, returning to his normal weight of 202 pounds. He has some knee problems, which he manages with shots, and has noticed some improvement in his right eye vision after starting a new vitamin regimen.      Review of Systems  Cardiovascular:  Negative for chest pain, dyspnea on exertion and leg swelling.    Risk Assessment/Calculations:      Lab Results  Component Value Date   CHOL 205 (H) 05/26/2023   HDL 59.70  05/26/2023   LDLCALC 124 (H) 05/26/2023   TRIG 102.0 05/26/2023   CHOLHDL 3 05/26/2023   Lab Results  Component Value Date   NA 140 05/26/2023   K 4.2 05/26/2023   CO2 30 05/26/2023   GLUCOSE 95 05/26/2023   BUN 18 05/26/2023   CREATININE 0.75 05/26/2023   CALCIUM 9.7 05/26/2023   GFR 91.24 05/26/2023   Lab Results  Component Value Date   WBC 7.1 05/26/2023   HGB 13.5 05/26/2023   HCT 40.9 05/26/2023   MCV 86.5 05/26/2023   PLT 303.0 05/26/2023    Physical Exam:   VS:  BP 106/72 (BP Location: Left Arm, Patient Position: Sitting, Cuff Size: Normal)   Pulse (!) 56   Resp 16   Ht 5\' 11"  (1.803 m)   Wt 202 lb 3.2 oz (91.7 kg)   SpO2 98%   BMI 28.20 kg/m    Wt Readings from Last 3 Encounters:  06/30/23 202 lb 3.2 oz (91.7 kg)  06/16/23 196 lb 3.2 oz (89 kg)  05/26/23 193 lb 9.6 oz (87.8 kg)     Physical Exam Neck:     Vascular: No carotid bruit or JVD.  Cardiovascular:     Rate and Rhythm: Normal rate and regular rhythm.     Pulses: Intact distal pulses.     Heart sounds: Normal heart sounds. No murmur heard.    No gallop.  Pulmonary:     Effort: Pulmonary effort is normal.     Breath sounds: Normal breath sounds.  Abdominal:  General: Bowel sounds are normal.     Palpations: Abdomen is soft.  Musculoskeletal:     Right lower leg: No edema.     Left lower leg: No edema.     Studies Reviewed: Marland Kitchen    EKG:    EKG Interpretation Date/Time:  Tuesday June 30 2023 08:26:18 EDT Ventricular Rate:  55 PR Interval:  156 QRS Duration:  126 QT Interval:  460 QTC Calculation: 440 R Axis:   65  Text Interpretation: EKG 06/30/2023: Normal sinus rhythm at the rate of 55 bpm, normal axis, right bundle branch block.  No evidence of ischemia.  Normal QT interval. Confirmed by Delrae Rend [phone]) on 06/30/2023 8:36:56 AM    EKG 05/26/2023: Normal sinus rhythm at rate of 65 bpm, left enlargement, normal axis.  Right bundle branch block.  PVCs (3), unifocal.   Low-voltage complexes.  ASSESSMENT AND PLAN: .      ICD-10-CM   1. Nonspecific abnormal electrocardiogram (ECG) (EKG)  R94.31 EKG 12-Lead    2. 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 Mild hypercholesterolemia  E78.00 EKG 12-Lead    atorvastatin (LIPITOR) 10 MG tablet    4. Vapes nicotine containing substance  Z72.0       Assessment and Plan    Premature Ventricular Contractions (PVCs) Noted on EKG, likely related to vaping. No associated symptoms reported.  He is completely asymptomatic, does heavy farm work, heavy building, no dizziness or syncope, physical examination is completely normal, today the EKG is normal as well.  Do not think he needs any further evaluation. -Advised to quit vaping to potentially reduce PVCs.  Hyperlipidemia Mildly elevated cholesterol levels, likely related to diet high in red meat.  In view of tobacco use, advanced age, we will go ahead and start him on atorvastatin 10 mg daily, will request his PCP to follow-up including lab work. -Start Atorvastatin (Lipitor) 10mg  daily. -Advised to reduce red meat intake and increase vegetable consumption.   General Health Maintenance -Encouraged to continue with current level of physical activity. -Advised to monitor for symptoms such as chest pain, shortness of breath, or racing heart, and to contact the office if these occur.   Tobacco use disorder Patient uses vape which is equivalent to 1 pack of cigarettes a day.  Extensive discussion with the patient regarding smoking cessation, I have discussed with him regarding risk of cardiovascular disease, cancer, dementia.  Offered help.  Patient wants to think about this but is not motivated in quitting smoking at this present time.  Weight loss Although patient stated that he lost about 40 to 50 pounds in weight over the past 6 months, on further questioning, patient is back to his baseline weight around 205 to 207 pounds which is his baseline.  States that he was  eating poorly and had gained significant amount of weight and with diet and exercise he has lost his weight and is back to his baseline.    Signed,  Yates Decamp, MD, [facility] 06/30/2023, 8:55 AM [facility] [address] [place], [place] [address] Phone: [phone]. Fax:  [phone]

## 2023-06-30 ENCOUNTER — Ambulatory Visit: Payer: PPO | Attending: Cardiology | Admitting: Cardiology

## 2023-06-30 ENCOUNTER — Encounter: Payer: Self-pay | Admitting: Cardiology

## 2023-06-30 VITALS — BP 106/72 | HR 56 | Resp 16 | Ht 71.0 in | Wt 202.2 lb

## 2023-06-30 DIAGNOSIS — E7800 Pure hypercholesterolemia, unspecified: Secondary | ICD-10-CM

## 2023-06-30 DIAGNOSIS — I493 Ventricular premature depolarization: Secondary | ICD-10-CM | POA: Diagnosis not present

## 2023-06-30 DIAGNOSIS — F17290 Nicotine dependence, other tobacco product, uncomplicated: Secondary | ICD-10-CM

## 2023-06-30 DIAGNOSIS — Z720 Tobacco use: Secondary | ICD-10-CM

## 2023-06-30 DIAGNOSIS — R9431 Abnormal electrocardiogram [ECG] [EKG]: Secondary | ICD-10-CM | POA: Diagnosis not present

## 2023-06-30 MED ORDER — ATORVASTATIN CALCIUM 10 MG PO TABS
10.0000 mg | ORAL_TABLET | Freq: Every day | ORAL | 0 refills | Status: DC
Start: 2023-06-30 — End: 2023-07-06

## 2023-06-30 NOTE — Patient Instructions (Signed)
Medication Instructions:  Your physician has recommended you make the following change in your medication:   1) START atorvastatin (Lipitor) 10 mg daily  *If you need a refill on your cardiac medications before your next appointment, please call your pharmacy*  Lab Work: None ordered If you have labs (blood work) drawn today and your tests are completely normal, you will receive your results only by: MyChart Message (if you have MyChart) OR A paper copy in the mail If you have any lab test that is abnormal or we need to change your treatment, we will call you to review the results.  Testing/Procedures: None ordered  Follow-Up: As needed

## 2023-07-01 ENCOUNTER — Ambulatory Visit: Payer: PPO | Admitting: Acute Care

## 2023-07-01 ENCOUNTER — Encounter: Payer: Self-pay | Admitting: Acute Care

## 2023-07-01 DIAGNOSIS — Z87891 Personal history of nicotine dependence: Secondary | ICD-10-CM | POA: Diagnosis not present

## 2023-07-01 DIAGNOSIS — Z122 Encounter for screening for malignant neoplasm of respiratory organs: Secondary | ICD-10-CM

## 2023-07-01 NOTE — Progress Notes (Signed)
  Virtual Visit via Telephone Note  I connected with Isaiah Williams on 07/01/23 at  9:00 AM EDT by telephone and verified that I am speaking with the correct person using two identifiers.  Location: Patient:  At home Provider:  [address], [place], [place], [address]    I discussed the limitations, risks, security and privacy concerns of performing an evaluation and management service by telephone and the availability of in person appointments. I also discussed with the patient that there may be a patient responsible charge related to this service. The patient expressed understanding and agreed to proceed.    Shared Decision Making Visit Lung Cancer Screening Program [phone])   Eligibility: Age 13 y.o. Pack Years Smoking History Calculation 37 pack year smoking history (# packs/per year x # years smoked) Recent History of coughing up blood  no Unexplained weight loss? no ( >Than 15 pounds within the last 6 months ) Prior History Lung / other cancer no (Diagnosis within the last 5 years already requiring surveillance chest CT Scans). Smoking Status Former Smoker Former Smokers: Years since quit: 5 years  Quit Date: 2019  Visit Components: Discussion included one or more decision making aids. yes Discussion included risk/benefits of screening. yes Discussion included potential follow up diagnostic testing for abnormal scans. yes Discussion included meaning and risk of over diagnosis. yes Discussion included meaning and risk of False Positives. yes Discussion included meaning of total radiation exposure. yes  Counseling Included: Importance of adherence to annual lung cancer LDCT screening. yes Impact of comorbidities on ability to participate in the program. yes Ability and willingness to under diagnostic treatment. yes  Smoking Cessation Counseling: Current Smokers:  Discussed importance of smoking cessation. yes Information about tobacco cessation classes and  interventions provided to patient. yes Patient provided with "ticket" for LDCT Scan. yes Symptomatic Patient. no  Counseling NA Diagnosis Code: Tobacco Use Z72.0 Asymptomatic Patient yes  Counseling (Intermediate counseling: > three minutes counseling) U0454 Former Smokers:  Discussed the importance of maintaining cigarette abstinence. yes Diagnosis Code: Personal History of Nicotine Dependence. U98.119 Information about tobacco cessation classes and interventions provided to patient. Yes Patient provided with "ticket" for LDCT Scan. yes Written Order for Lung Cancer Screening with LDCT placed in Epic. Yes (CT Chest Lung Cancer Screening Low Dose W/O CM) [id number] Z12.2-Screening of respiratory organs Z87.891-Personal history of nicotine dependence   Bevelyn Ngo, NP 07/01/2023

## 2023-07-01 NOTE — Patient Instructions (Signed)

## 2023-07-03 ENCOUNTER — Ambulatory Visit
Admission: RE | Admit: 2023-07-03 | Discharge: 2023-07-03 | Disposition: A | Discharge status: Home / Self Care | Payer: PPO | Source: Ambulatory Visit | Attending: Acute Care | Admitting: Acute Care

## 2023-07-03 DIAGNOSIS — Z122 Encounter for screening for malignant neoplasm of respiratory organs: Secondary | ICD-10-CM | POA: Insufficient documentation

## 2023-07-03 DIAGNOSIS — Z87891 Personal history of nicotine dependence: Secondary | ICD-10-CM | POA: Insufficient documentation

## 2023-07-06 ENCOUNTER — Encounter: Payer: Self-pay | Admitting: Cardiology

## 2023-07-06 DIAGNOSIS — E7800 Pure hypercholesterolemia, unspecified: Secondary | ICD-10-CM

## 2023-07-06 MED ORDER — ATORVASTATIN CALCIUM 10 MG PO TABS: 10.0000 mg | ORAL_TABLET | Freq: Every day | ORAL | 3 refills | Status: DC

## 2023-07-07 ENCOUNTER — Ambulatory Visit: Payer: PPO | Admitting: Podiatry

## 2023-07-07 ENCOUNTER — Encounter: Payer: Self-pay | Admitting: Podiatry

## 2023-07-07 VITALS — BP 128/76 | HR 58

## 2023-07-07 DIAGNOSIS — L603 Nail dystrophy: Secondary | ICD-10-CM

## 2023-07-07 NOTE — Progress Notes (Signed)
Subjective:  Patient ID: Isaiah Williams, male    DOB: 12-08-1950,  MRN: [id number]  Chief Complaint  Patient presents with   Nail Problem    "I injured my toe years ago.  The toenail is sitting straight up.  I grinded it down with a dremel.     Foot Pain    "I have a knot on the side of my right foot.  It doesn't hurt."    72 y.o. male presents with the above complaint.  Patient presents with left great toenail nail dystrophy.  Patient had a trauma to the nail.  Patient states been sitting up and lifted up and hurts minoxidil hold out of the shoe.  He wanted to have removed made permanent pain scale is 3 out of 10 dull aching nature.  He states he is a high tolerance for pain.   Review of Systems: Negative except as noted in the HPI. Denies N/V/F/Ch.  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    Irregular heart rate    Long toenail    Restlessness    Skin lesions    Skin lesions    Ventricular ectopic beat    Vitreous degeneration, left eye     Current Outpatient Medications:    atorvastatin (LIPITOR) 10 MG tablet, Take 1 tablet (10 mg total) by mouth daily., Disp: 90 tablet, Rfl: 3   Multiple Vitamins-Minerals (EYE MULTIVITAMIN/LUTEIN) CAPS, Take 2 capsules by mouth daily., Disp: , Rfl:    vitamin B-12 (CYANOCOBALAMIN) 100 MCG tablet, Take 100 mcg by mouth daily., Disp: , Rfl:   Social History   Tobacco Use  Smoking Status Every Day   Current packs/day: 0.00   Average packs/day: 10.0 packs/day for 10.0 years (100.0 ttl pk-yrs)   Types: E-cigarettes, Cigarettes   Start date: 2004   Last attempt to quit: 2019   Years since quitting: 5.8  Smokeless Tobacco Never  Tobacco Comments   Currently Vapingl, no cigarettes - DJM 07/08/2023    No Known Allergies Objective:   Vitals:   07/07/23 1101  BP: 128/76  Pulse:  (!) 58   There is no height or weight on file to calculate BMI. Constitutional Well developed. Well nourished.  Vascular Dorsalis pedis pulses palpable bilaterally. Posterior tibial pulses palpable bilaterally. Capillary refill normal to all digits.  No cyanosis or clubbing noted. Pedal hair growth normal.  Neurologic Normal speech. Oriented to person, place, and time. Epicritic sensation to light touch grossly present bilaterally.  Dermatologic Pain on palpation of the entire/total nail on 1st digit of the left No other open wounds. No skin lesions.  Orthopedic: Normal joint ROM without pain or crepitus bilaterally. No visible deformities. No bony tenderness.   Radiographs: None Assessment:   1. Nail dystrophy    Plan:  Patient was evaluated and treated and all questions answered.  Nail contusion/dystrophy hallux, left -Patient elects to proceed with minor surgery to remove entire toenail today. Consent reviewed and signed by patient. -Entire/total nail excised. See procedure note. -Educated on post-procedure care including soaking. Written instructions provided and reviewed. -Patient to follow up in 2 weeks for nail check.  Procedure: Excision of entire/total nail with phenol matricectomy Location: Left 1st toe digit Anesthesia: Lidocaine 1% plain; 1.5 mL and Marcaine 0.5% plain; 1.5 mL, digital block. Skin Prep: Betadine. Dressing: Silvadene; telfa; dry,  sterile, compression dressing. Technique: Following skin prep, the toe was exsanguinated and a tourniquet was secured at the base of the toe. The affected nail border was freed and excised.  Phenol second was performed in standard technique the tourniquet was then removed and sterile dressing applied. Disposition: Patient tolerated procedure well. Patient to return in 2 weeks for follow-up.   No follow-ups on file.

## 2023-07-28 ENCOUNTER — Other Ambulatory Visit: Payer: Self-pay | Admitting: Acute Care

## 2023-07-28 DIAGNOSIS — Z87891 Personal history of nicotine dependence: Secondary | ICD-10-CM

## 2023-07-28 DIAGNOSIS — Z122 Encounter for screening for malignant neoplasm of respiratory organs: Secondary | ICD-10-CM

## 2023-09-02 ENCOUNTER — Ambulatory Visit: Payer: PPO

## 2023-09-02 VITALS — Ht 71.0 in | Wt 200.0 lb

## 2023-09-02 DIAGNOSIS — Z0000 Encounter for general adult medical examination without abnormal findings: Secondary | ICD-10-CM | POA: Diagnosis not present

## 2023-09-02 NOTE — Patient Instructions (Addendum)
Isaiah Williams , Thank you for taking time to come for your Medicare Wellness Visit. I appreciate your ongoing commitment to your health goals. Please review the following plan we discussed and let me know if I can assist you in the future.   Referrals/Orders/Follow-Ups/Clinician Recommendations: none  This is a list of the screening recommended for you and due dates:  Health Maintenance  Topic Date Due   Hepatitis C Screening  Never done   Colon Cancer Screening  Never done   Zoster (Shingles) Vaccine (1 of 2) Never done   COVID-19 Vaccine (1 - 2024-25 season) Never done   Screening for Lung Cancer  07/02/2024   Medicare Annual Wellness Visit  09/01/2024   DTaP/Tdap/Td vaccine (2 - Td or Tdap) 03/25/2029   Pneumonia Vaccine  Completed   Flu Shot  Completed   HPV Vaccine  Aged Out    Advanced directives: (In Chart) A copy of your advanced directives are scanned into your chart should your provider ever need it.  Next Medicare Annual Wellness Visit scheduled for next year: Yes 09/02/2024 @ 10:50am video

## 2023-09-02 NOTE — Progress Notes (Signed)
Subjective:   Isaiah Williams is a 72 y.o. male who presents for an Initial Medicare Annual Wellness Visit.  Visit Complete: Virtual I connected with  [name] on 09/02/23 by a audio enabled telemedicine application and verified that I am speaking with the correct person using two identifiers.  Patient Location: Home  Provider Location: Office/Clinic  I discussed the limitations of evaluation and management by telemedicine. The patient expressed understanding and agreed to proceed.  Vital Signs: Because this visit was a virtual/telehealth visit, some criteria may be missing or patient reported. Any vitals not documented were not able to be obtained and vitals that have been documented are patient reported.  Patient Medicare AWV questionnaire was completed by the patient on 09/01/23; I have confirmed that all information answered by patient is correct and no changes since this date.  Cardiac Risk Factors include: advanced age (>12men, >4 women);male gender    Objective:    Today's Vitals   09/02/23 1054  Weight: 200 lb (90.7 kg)  Height: 5\' 11"  (1.803 m)   Body mass index is 27.89 kg/m.     09/02/2023   10:59 AM  Advanced Directives  Does Patient Have a Medical Advance Directive? Yes  Type of Estate agent of [place];Living will  Copy of Healthcare Power of Attorney in Chart? Yes - validated most recent copy scanned in chart (See row information)    Current Medications (verified) Outpatient Encounter Medications as of 09/02/2023  Medication Sig   atorvastatin (LIPITOR) 10 MG tablet Take 1 tablet (10 mg total) by mouth daily.   Multiple Vitamins-Minerals (EYE MULTIVITAMIN/LUTEIN) CAPS Take 2 capsules by mouth daily.   vitamin B-12 (CYANOCOBALAMIN) 100 MCG tablet Take 100 mcg by mouth daily.   No facility-administered encounter medications on file as of 09/02/2023.    Allergies (verified) Patient has no known allergies.    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    Irregular heart rate    Long toenail    Restlessness    Skin lesions    Skin lesions    Ventricular ectopic beat    Vitreous degeneration, left eye    Past Surgical History:  Procedure Laterality Date   APPENDECTOMY     DENTAL SURGERY     teeth removed, now with dentures   eye surgery     macular degeneration 2019 laser surgery right eye   VASECTOMY     Family History  Problem Relation Age of Onset   Lung cancer Mother    Breast cancer Mother    Healthy Father    Bipolar disorder Brother    Diabetes Maternal Grandmother        bil LE amputee   Lung cancer Maternal Grandfather        smoker   Alzheimer's disease Paternal Grandmother    Heart failure Paternal Grandfather    Social History   Socioeconomic History   Marital status: Divorced    Spouse name: Not on file   Number of children: 0   Years of education: Not on file   Highest education level: Not on file  Occupational History   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  Tobacco Use   Smoking status: Every Day    Current packs/day: 0.00    Average packs/day: 10.0 packs/day for 10.0  years (100.0 ttl pk-yrs)    Types: E-cigarettes, Cigarettes    Start date: 2004    Last attempt to quit: 2019    Years since quitting: 5.9   Smokeless tobacco: Never   Tobacco comments:    Currently Vapingl, no cigarettes - DJM 07/08/2023  Vaping Use   Vaping status: Every Day   Start date: 05/25/2013   Substances: Nicotine  Substance and Sexual Activity   Alcohol use: Not Currently    Comment: rarerly six times a year   Drug use: Never   Sexual activity: Not Currently    Partners: Female  Other Topics Concern   Not on file  Social History Narrative   Not on file   Social Drivers of Health   Financial Resource  Strain: Low Risk  (09/01/2023)   Overall Financial Resource Strain (CARDIA)    Difficulty of Paying Living Expenses: Not hard at all  Food Insecurity: No Food Insecurity (09/01/2023)   Hunger Vital Sign    Worried About Running Out of Food in the Last Year: Never true    Ran Out of Food in the Last Year: Never true  Transportation Needs: No Transportation Needs (09/01/2023)   PRAPARE - Administrator, Civil Service (Medical): No    Lack of Transportation (Non-Medical): No  Physical Activity: Sufficiently Active (09/01/2023)   Exercise Vital Sign    Days of Exercise per Week: 6 days    Minutes of Exercise per Session: 120 min  Stress: No Stress Concern Present (09/01/2023)   [employer] of Occupational Health - Occupational Stress Questionnaire    Feeling of Stress : Not at all  Social Connections: Unknown (09/01/2023)   Social Connection and Isolation Panel [NHANES]    Frequency of Communication with Friends and Family: More than three times a week    Frequency of Social Gatherings with Friends and Family: More than three times a week    Attends Religious Services: Not on Marketing executive or Organizations: No    Attends Banker Meetings: Never    Marital Status: Divorced    Tobacco Counseling Ready to quit: Not Answered Counseling given: Not Answered Tobacco comments: Currently Vapingl, no cigarettes - DJM 07/08/2023  Clinical Intake:  Pre-visit preparation completed: Yes  Pain : No/denies pain   BMI - recorded: 27.89 Nutritional Status: BMI 25 -29 Overweight Nutritional Risks: None Diabetes: No  How often do you need to have someone help you when you read instructions, pamphlets, or other written materials from your doctor or pharmacy?: 1 - Never  Interpreter Needed?: No  Comments: lives alone Information entered by :: B.Merick Kelleher,LPN   Activities of Daily Living    09/01/2023    5:47 PM  In your present state of  health, do you have any difficulty performing the following activities:  Hearing? 0  Vision? 0  Difficulty concentrating or making decisions? 0  Walking or climbing stairs? 1  Dressing or bathing? 0  Doing errands, shopping? 0  Preparing Food and eating ? N  Using the Toilet? N  In the past six months, have you accidently leaked urine? N  Do you have problems with loss of bowel control? N  Managing your Medications? N  Managing your Finances? N  Housekeeping or managing your Housekeeping? N    Patient Care Team: Mort Sawyers, FNP as PCP - General (Family Medicine) Antony Contras, MD as Consulting Physician (Ophthalmology) Yates Decamp, MD as Consulting Physician (Cardiology)  Candelaria Stagers, DPM as Consulting Physician (Podiatry)  Indicate any recent Medical Services you may have received from other than [facility] providers in the past year (date may be approximate).     Assessment:   This is a routine wellness examination for K[place].  Hearing/Vision screen Hearing Screening - Comments:: Pt says he hears well Vision Screening - Comments:: Pt says his eyesight is good    Goals Addressed             This Visit's Progress    Patient Stated       I would like to have a better diet and great nutrition       Depression Screen    09/02/2023   10:57 AM 05/26/2023   10:25 AM  PHQ 2/9 Scores  PHQ - 2 Score 0 3  PHQ- 9 Score  3    Fall Risk    09/01/2023    5:47 PM 05/26/2023   10:24 AM  Fall Risk   Falls in the past year? 0 0  Number falls in past yr: 0 0  Injury with Fall? 0 0  Risk for fall due to : No Fall Risks No Fall Risks  Follow up Education provided;Falls prevention discussed Falls evaluation completed    MEDICARE RISK AT HOME: Medicare Risk at Home Any stairs in or around the home?: Yes If so, are there any without handrails?: No Home free of loose throw rugs in walkways, pet beds, electrical cords, etc?: Yes Adequate lighting in your home to reduce risk  of falls?: Yes Life alert?: No Use of a cane, walker or w/c?: Yes Grab bars in the bathroom?: Yes Shower chair or bench in shower?: Yes Elevated toilet seat or a handicapped toilet?: Yes  TIMED UP AND GO:  Was the test performed? No    Cognitive Function:        09/02/2023   11:09 AM  6CIT Screen  What Year? 0 points  What month? 0 points  What time? 0 points  Count back from 20 0 points  Months in reverse 0 points  Repeat phrase 0 points  Total Score 0 points    Immunizations Immunization History  Administered Date(s) Administered   Fluad Trivalent(High Dose 65+) 05/26/2023   PNEUMOCOCCAL CONJUGATE-20 05/26/2023   Tdap 03/26/2019    TDAP status: Up to date  Flu Vaccine status: Up to date  Pneumococcal vaccine status: Up to date  Covid-19 vaccine status: Declined, Education has been provided regarding the importance of this vaccine but patient still declined. Advised may receive this vaccine at local pharmacy or Health Dept.or vaccine clinic. Aware to provide a copy of the vaccination record if obtained from local pharmacy or Health Dept. Verbalized acceptance and understanding.  Qualifies for Shingles Vaccine? Yes   Zostavax completed No   Shingrix Completed?: No.    Education has been provided regarding the importance of this vaccine. Patient has been advised to call insurance company to determine out of pocket expense if they have not yet received this vaccine. Advised may also receive vaccine at local pharmacy or Health Dept. Verbalized acceptance and understanding.  Screening Tests Health Maintenance  Topic Date Due   Zoster Vaccines- Shingrix (1 of 2) 12/01/2023 (Originally 08/01/2002)   COVID-19 Vaccine (1 - 2024-25 season) 12/14/2023 (Originally 05/17/2023)   Colonoscopy  09/01/2024 (Originally 08/01/1997)   Hepatitis C Screening  09/01/2024 (Originally 08/01/1970)   Lung Cancer Screening  07/02/2024   Medicare Annual Wellness (AWV)  09/01/2024  DTaP/Tdap/Td (2 - Td or Tdap) 03/25/2029   Pneumonia Vaccine 43+ Years old  Completed   INFLUENZA VACCINE  Completed   HPV VACCINES  Aged Out    Health Maintenance  There are no preventive care reminders to display for this patient.   Colorectal cancer screening: Type of screening: Cologuard. Completed 9/24. Repeat every 3 years  Lung Cancer Screening: (Low Dose CT Chest recommended if Age 83-80 years, 20 pack-year currently smoking OR have quit w/in 15years.) does not qualify.   Lung Cancer Screening Referral: screening done in Oct 2024  Additional Screening:  Hepatitis C Screening: does not qualify; Completed no   Vision Screening: Recommended annual ophthalmology exams for early detection of glaucoma and other disorders of the eye. Is the patient up to date with their annual eye exam?  no Who is the provider or what is the name of the office in which the patient attends annual eye exams? [facility] If pt is not established with a provider, would they like to be referred to a provider to establish care? No .   Dental Screening: Recommended annual dental exams for proper oral hygiene  Diabetic Foot Exam:   Community Resource Referral / Chronic Care Management: CRR required this visit?  No   CCM required this visit?  No    Plan:     I have personally reviewed and noted the following in the patient's chart:   Medical and social history Use of alcohol, tobacco or illicit drugs  Current medications and supplements including opioid prescriptions. Patient is not currently taking opioid prescriptions. Functional ability and status Nutritional status Physical activity Advanced directives List of other physicians Hospitalizations, surgeries, and ER visits in previous 12 months Vitals Screenings to include cognitive, depression, and falls Referrals and appointments  In addition, I have reviewed and discussed with patient certain preventive protocols, quality metrics, and best  practice recommendations. A written personalized care plan for preventive services as well as general preventive health recommendations were provided to patient.    Sue Lush, LPN   [id number]   After Visit Summary: (MyChart) Due to this being a telephonic visit, the after visit summary with patients personalized plan was offered to patient via MyChart   Nurse Notes: The patient states he is doing well and has no concerns or questions at this time.

## 2023-09-30 ENCOUNTER — Telehealth: Payer: Self-pay | Admitting: Family

## 2023-09-30 DIAGNOSIS — I714 Abdominal aortic aneurysm, without rupture, unspecified: Secondary | ICD-10-CM | POA: Insufficient documentation

## 2023-09-30 NOTE — Telephone Encounter (Signed)
 The lung cancer screening CT did show an ascending aortic aneurysm this was what I had discussed with you that we were going to schedule the ultrasound for to see if you had this (due to being a smoker in the past)   For this, thankfully the measurements are under 5 mm so we just need to monitor it once yearly.   Next year I will order a CTA which is a closer look and we will do this yearly.  In case he wants some education   An abdominal aortic aneurysm is an enlarged area in the lower part of the body's main artery, called the aorta. The aorta runs from the heart through the center of the chest and belly area, called the abdomen.   If you have a growing abdominal aortic aneurysm, you might notice: Deep, constant pain in the belly area or side of the belly. Back pain. A pulse near the bellybutton.

## 2023-09-30 NOTE — Telephone Encounter (Signed)
 Spoke with pt and he is aware of the below information. Nothing further was needed.

## 2024-05-26 ENCOUNTER — Encounter: Payer: Self-pay | Admitting: Family

## 2024-05-26 ENCOUNTER — Ambulatory Visit: Payer: PPO | Admitting: Family

## 2024-05-26 VITALS — BP 134/82 | HR 77 | Temp 98.4°F | Ht 71.0 in | Wt 223.4 lb

## 2024-05-26 DIAGNOSIS — I493 Ventricular premature depolarization: Secondary | ICD-10-CM

## 2024-05-26 DIAGNOSIS — H353124 Nonexudative age-related macular degeneration, left eye, advanced atrophic with subfoveal involvement: Secondary | ICD-10-CM | POA: Diagnosis not present

## 2024-05-26 DIAGNOSIS — E782 Mixed hyperlipidemia: Secondary | ICD-10-CM

## 2024-05-26 DIAGNOSIS — H353213 Exudative age-related macular degeneration, right eye, with inactive scar: Secondary | ICD-10-CM | POA: Diagnosis not present

## 2024-05-26 DIAGNOSIS — H43812 Vitreous degeneration, left eye: Secondary | ICD-10-CM

## 2024-05-26 DIAGNOSIS — Z23 Encounter for immunization: Secondary | ICD-10-CM

## 2024-05-26 DIAGNOSIS — I7121 Aneurysm of the ascending aorta, without rupture: Secondary | ICD-10-CM

## 2024-05-26 DIAGNOSIS — Z720 Tobacco use: Secondary | ICD-10-CM | POA: Diagnosis not present

## 2024-05-26 DIAGNOSIS — E538 Deficiency of other specified B group vitamins: Secondary | ICD-10-CM | POA: Diagnosis not present

## 2024-05-26 DIAGNOSIS — Z0001 Encounter for general adult medical examination with abnormal findings: Secondary | ICD-10-CM

## 2024-05-26 DIAGNOSIS — Z79899 Other long term (current) drug therapy: Secondary | ICD-10-CM | POA: Diagnosis not present

## 2024-05-26 DIAGNOSIS — Z0000 Encounter for general adult medical examination without abnormal findings: Secondary | ICD-10-CM

## 2024-05-26 LAB — COMPREHENSIVE METABOLIC PANEL WITH GFR
ALT: 25 U/L (ref 0–53)
AST: 17 U/L (ref 0–37)
Albumin: 4.3 g/dL (ref 3.5–5.2)
Alkaline Phosphatase: 79 U/L (ref 39–117)
BUN: 14 mg/dL (ref 6–23)
CO2: 27 meq/L (ref 19–32)
Calcium: 9.8 mg/dL (ref 8.4–10.5)
Chloride: 104 meq/L (ref 96–112)
Creatinine, Ser: 0.89 mg/dL (ref 0.40–1.50)
GFR: 86.03 mL/min (ref >60.00)
Glucose, Bld: 100 mg/dL — ABNORMAL HIGH (ref 70–99)
Potassium: 4 meq/L (ref 3.5–5.1)
Sodium: 138 meq/L (ref 135–145)
Total Bilirubin: 1.1 mg/dL (ref 0.2–1.2)
Total Protein: 7.1 g/dL (ref 6.0–8.3)

## 2024-05-26 LAB — LIPID PANEL
Cholesterol: 196 mg/dL (ref 0–200)
HDL: 40.9 mg/dL (ref >39.00)
LDL Cholesterol: 130 mg/dL — ABNORMAL HIGH (ref 0–99)
NonHDL: 155.27
Total CHOL/HDL Ratio: 5
Triglycerides: 128 mg/dL (ref 0.0–149.0)
VLDL: 25.6 mg/dL (ref 0.0–40.0)

## 2024-05-26 LAB — VITAMIN B12: Vitamin B-12: 1153 pg/mL — ABNORMAL HIGH (ref 211–911)

## 2024-05-26 MED ORDER — VITAMIN D3 50 MCG (2000 UT) PO CAPS: ORAL_CAPSULE | ORAL | Status: AC

## 2024-05-26 NOTE — Progress Notes (Signed)
 Subjective:  Patient ID: Isaiah Williams, male    DOB: 12/14/1950  Age: 73 y.o. MRN: [id number]  Patient Care Team: Corwin Antu, FNP as PCP - General (Family Medicine) Charmayne Molly, MD as Consulting Physician (Ophthalmology) Ladona Heinz, MD as Consulting Physician (Cardiology) Tobie Franky SQUIBB, DPM as Consulting Physician (Podiatry)   CC:  Chief Complaint  Patient presents with   Annual Exam    HPI Isaiah Williams is a 73 y.o. male who presents today for an annual physical exam. He reports consuming a general diet. Home exercise routine includes calisthenics. He generally feels well. He reports sleeping well. He does have additional problems to discuss today.   Vision:Not within last year Dental:Receives regular dental care  Lung Cancer Screening with low-dose Chest CT: due again has scheduled 07/05/14  AAA Screening: annual follow up AAA due this year   Cologuard 2024 every three years was negative   Pt is with acute concerns.   Discussed the use of AI scribe software for clinical note transcription with the patient, who gave verbal consent to proceed.  History of Present Illness Isaiah Williams is a 73 year old male with knee pain and macular degeneration who presents for evaluation of knee pain and an eye check-up.  He has persistent knee pain, primarily in the right knee, which has worsened over the past six months. He experiences difficulty stepping up and getting out of a chair, relying more on his arm muscles for support. He uses ibuprofen 200 mg as needed, typically one or two tablets, and occasionally applies Voltaren gel. He has started new exercises, including a [place] routine, to strengthen his muscles.  He has a history of macular degeneration and feels his vision has slightly worsened over the past few years. He has not had an eye examination in several years and is seeking a referral for an eye doctor.  He mentions a change in his diet, now consuming  more water, lean proteins, and beans, while reducing his intake of pastries and gluten. He has cut back on vaping nicotine and continues to drink coffee and milk. He takes atorvastatin  for cholesterol management and B12 supplements regularly, with occasional vitamin D supplementation.  He has a history of aortic aneurysm, which is monitored annually. He has a normal sinus rhythm with a right bundle branch block noted on a previous EKG. He has reduced his vaping frequency.   Advanced Directives Patient does have advanced directives . He does not have a copy in the electronic medical record.   DEPRESSION SCREENING    09/02/2023   10:57 AM 05/26/2023   10:25 AM  PHQ 2/9 Scores  PHQ - 2 Score 0 3  PHQ- 9 Score  3     ROS: Negative unless specifically indicated above in HPI.    Current Outpatient Medications:    atorvastatin  (LIPITOR) 10 MG tablet, Take 1 tablet (10 mg total) by mouth daily., Disp: 90 tablet, Rfl: 3   Cholecalciferol (VITAMIN D3) 50 MCG (2000 UT) capsule, Take one po every day, Disp: , Rfl:    Multiple Vitamins-Minerals (EYE MULTIVITAMIN/LUTEIN) CAPS, Take 2 capsules by mouth daily., Disp: , Rfl:    vitamin B-12 (CYANOCOBALAMIN ) 100 MCG tablet, Take 100 mcg by mouth daily., Disp: , Rfl:     Objective:    BP 134/82 (BP Location: Left Arm, Patient Position: Sitting, Cuff Size: Normal)   Pulse 77   Temp 98.4 F (36.9 C) (Temporal)   Ht 5' 11 (1.803  m)   Wt 223 lb 6.4 oz (101.3 kg)   SpO2 96%   BMI 31.16 kg/m   BP Readings from Last 3 Encounters:  05/26/24 134/82  07/07/23 128/76  06/30/23 106/72      Physical Exam Vitals reviewed.  Constitutional:      General: He is not in acute distress.    Appearance: Normal appearance. He is obese. He is not ill-appearing, toxic-appearing or diaphoretic.  HENT:     Head: Normocephalic.     Right Ear: Tympanic membrane normal.     Left Ear: Tympanic membrane normal.     Nose: Nose normal.     Mouth/Throat:      Mouth: Mucous membranes are moist.     Dentition: Abnormal dentition (no teeth present). Does not have dentures.  Eyes:     Pupils: Pupils are equal, round, and reactive to light.  Cardiovascular:     Rate and Rhythm: Normal rate and regular rhythm.  Pulmonary:     Effort: Pulmonary effort is normal.     Breath sounds: Normal breath sounds.  Musculoskeletal:        General: Normal range of motion.     Right lower leg: No edema.     Left lower leg: No edema.  Skin:    General: Skin is warm.  Neurological:     General: No focal deficit present.     Mental Status: He is alert and oriented to person, place, and time. Mental status is at baseline.  Psychiatric:        Mood and Affect: Mood normal.        Behavior: Behavior normal.        Thought Content: Thought content normal.        Judgment: Judgment normal.       Results DIAGNOSTIC EKG: Normal sinus rhythm, rate 55 bpm, right bundle branch block, no evidence of ischemia, normal QT interval, unifocal premature ventricular contractions (PVCs) likely related to vaping (05/31/2023)      Assessment & Plan:   Assessment and Plan Assessment & Plan Aneurysm of the ascending aorta, without rupture Aneurysm of the ascending aorta, measuring 4.0 cm, requires annual monitoring for size changes. - Order CTA of the aorta after October 18th for annual monitoring. - Refer to vascular surgery if significant growth is noted.  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 Symptoms have worsened over the past six months. Currently using ibuprofen and Voltaren gel for pain management. - Consider x-ray of the right knee if symptoms persist or worsen. - Continue Voltaren gel and ibuprofen as needed, with cautious use of ibuprofen due to aneurysm. - Consider Tylenol Arthritis if ibuprofen use increases. - Use compression sleeve on  days with increased activity. - Discuss potential for knee injections if pain worsens.  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 - Refer to [facility] for ophthalmology evaluation.  Ventricular premature depolarization Ventricular premature depolarizations on EKG, likely related to nicotine use. Asymptomatic with normal sinus rhythm and right bundle branch block. - Encourage reduction in nicotine use to potentially reduce PVCs.  Mixed hyperlipidemia Mixed hyperlipidemia managed with atorvastatin . Plan to re-evaluate cholesterol levels. - Order repeat cholesterol lab work.  Vitamin B12 deficiency on supplementation Vitamin B12 deficiency managed with supplementation. Reports regular intake  of B12 and occasional vitamin D supplementation. - Order lab work to recheck B12 levels.  Tobacco use (including vaping) Ongoing tobacco use, primarily through vaping nicotine. Has reduced frequency but aims to decrease further. - Encourage further reduction in nicotine use. - Offer nicotine replacement therapy if needed.  Recording duration: 21 minutes        Follow-up: Return in about 1 year (around 05/26/2025) for f/u CPE.   Ginger Patrick, FNP

## 2024-05-26 NOTE — Patient Instructions (Signed)
  Recommend getting the shingles vaccination but needs to be at the pharmacy

## 2024-05-30 ENCOUNTER — Ambulatory Visit: Payer: Self-pay | Admitting: Family

## 2024-05-30 DIAGNOSIS — E782 Mixed hyperlipidemia: Secondary | ICD-10-CM

## 2024-05-30 MED ORDER — ATORVASTATIN CALCIUM 20 MG PO TABS: 20.0000 mg | ORAL_TABLET | Freq: Every day | ORAL | 3 refills | Status: AC

## 2024-06-04 ENCOUNTER — Encounter: Payer: Self-pay | Admitting: Family

## 2024-06-04 DIAGNOSIS — H353124 Nonexudative age-related macular degeneration, left eye, advanced atrophic with subfoveal involvement: Secondary | ICD-10-CM

## 2024-06-04 DIAGNOSIS — R531 Weakness: Secondary | ICD-10-CM

## 2024-06-04 DIAGNOSIS — H43812 Vitreous degeneration, left eye: Secondary | ICD-10-CM

## 2024-06-04 DIAGNOSIS — H353213 Exudative age-related macular degeneration, right eye, with inactive scar: Secondary | ICD-10-CM

## 2024-06-06 DIAGNOSIS — R531 Weakness: Secondary | ICD-10-CM | POA: Insufficient documentation

## 2024-06-06 NOTE — Telephone Encounter (Signed)
 Can we fill out handicap placard for him or have it in inbox?  Will sign for orthopedist condition permanent

## 2024-06-08 ENCOUNTER — Ambulatory Visit: Payer: PPO | Admitting: Dermatology

## 2024-06-10 NOTE — Telephone Encounter (Signed)
 picked up ppwk

## 2024-06-13 DIAGNOSIS — H35363 Drusen (degenerative) of macula, bilateral: Secondary | ICD-10-CM | POA: Diagnosis not present

## 2024-06-13 DIAGNOSIS — H524 Presbyopia: Secondary | ICD-10-CM | POA: Diagnosis not present

## 2024-06-13 DIAGNOSIS — H2513 Age-related nuclear cataract, bilateral: Secondary | ICD-10-CM | POA: Diagnosis not present

## 2024-07-05 ENCOUNTER — Ambulatory Visit
Admission: RE | Admit: 2024-07-05 | Discharge: 2024-07-05 | Disposition: A | Discharge status: Home / Self Care | Source: Ambulatory Visit | Attending: Acute Care | Admitting: Acute Care

## 2024-07-05 DIAGNOSIS — I700 Atherosclerosis of aorta: Secondary | ICD-10-CM | POA: Diagnosis not present

## 2024-07-05 DIAGNOSIS — Z122 Encounter for screening for malignant neoplasm of respiratory organs: Secondary | ICD-10-CM | POA: Insufficient documentation

## 2024-07-05 DIAGNOSIS — Z87891 Personal history of nicotine dependence: Secondary | ICD-10-CM | POA: Insufficient documentation

## 2024-07-05 DIAGNOSIS — I7121 Aneurysm of the ascending aorta, without rupture: Secondary | ICD-10-CM | POA: Diagnosis not present

## 2024-07-08 ENCOUNTER — Other Ambulatory Visit: Payer: Self-pay | Admitting: Acute Care

## 2024-07-08 DIAGNOSIS — Z122 Encounter for screening for malignant neoplasm of respiratory organs: Secondary | ICD-10-CM

## 2024-07-08 DIAGNOSIS — Z87891 Personal history of nicotine dependence: Secondary | ICD-10-CM

## 2024-07-20 ENCOUNTER — Encounter: Payer: Self-pay | Admitting: *Deleted

## 2024-09-02 ENCOUNTER — Ambulatory Visit: Payer: PPO

## 2024-09-02 VITALS — BP 122/78 | HR 66 | Ht 71.0 in | Wt 214.0 lb

## 2024-09-02 DIAGNOSIS — Z0000 Encounter for general adult medical examination without abnormal findings: Secondary | ICD-10-CM | POA: Diagnosis not present

## 2024-09-02 NOTE — Progress Notes (Signed)
 "  Please attest and cosign this visit due to patients primary care provider not being in the office at the time the visit was completed.    Chief Complaint  Patient presents with   Medicare Wellness     Subjective:   Isaiah Williams is a 73 y.o. male who presents for a Medicare Annual Wellness Visit.  Visit info / Clinical Intake: Medicare Wellness Visit Type:: Subsequent Annual Wellness Visit Persons participating in visit and providing information:: patient Medicare Wellness Visit Mode:: Telephone If telephone:: video declined Since this visit was completed virtually, some vitals may be partially provided or unavailable. Missing vitals are due to the limitations of the virtual format.: Unable to obtain vitals - no equipment If Telephone or Video please confirm:: I connected with patient using audio/video enable telemedicine. I verified patient identity with two identifiers, discussed telehealth limitations, and patient agreed to proceed. Patient Location:: home Provider Location:: clinic Interpreter Needed?: No Pre-visit prep was completed: yes AWV questionnaire completed by patient prior to visit?: yes Date:: 08/29/24 Living arrangements:: (!) (Patient-Rptd) lives alone Patient's Overall Health Status Rating: (Patient-Rptd) very good Typical amount of pain: (Patient-Rptd) none Does pain affect daily life?: (Patient-Rptd) no Are you currently prescribed opioids?: no  Dietary Habits and Nutritional Risks How many meals a day?: (Patient-Rptd) 3 Eats fruit and vegetables daily?: (!) (Patient-Rptd) no Most meals are obtained by: (Patient-Rptd) preparing own meals In the last 2 weeks, have you had any of the following?: none Diabetic:: no  Functional Status Activities of Daily Living (to include ambulation/medication): (Patient-Rptd) Independent Ambulation: (Patient-Rptd) Independent Medication Administration: (Patient-Rptd) Independent Home Management (perform basic  housework or laundry): (Patient-Rptd) Independent Manage your own finances?: (Patient-Rptd) yes Primary transportation is: (Patient-Rptd) driving; family / friends Concerns about vision?: (!) yes (drives little due to macular degeneration) Concerns about hearing?: no  Fall Screening Falls in the past year?: (Patient-Rptd) 0 Number of falls in past year: 0 Was there an injury with Fall?: 0 Fall Risk Category Calculator: 0 Patient Fall Risk Level: Low Fall Risk  Fall Risk Patient at Risk for Falls Due to: No Fall Risks; Impaired vision Fall risk Follow up: Falls evaluation completed; Education provided; Falls prevention discussed  Home and Transportation Safety: All rugs have non-skid backing?: (!) (Patient-Rptd) no All stairs or steps have railings?: (Patient-Rptd) yes Grab bars in the bathtub or shower?: (Patient-Rptd) yes Have non-skid surface in bathtub or shower?: (Patient-Rptd) yes Good home lighting?: (Patient-Rptd) yes Regular seat belt use?: (Patient-Rptd) yes Hospital stays in the last year:: (Patient-Rptd) no  Cognitive Assessment Difficulty concentrating, remembering, or making decisions? : (Patient-Rptd) no Will 6CIT or Mini Cog be Completed: yes What year is it?: 0 points What month is it?: 0 points Give patient an address phrase to remember (5 components): [address] [place]  About what time is it?: 0 points Count backwards from 20 to 1: 0 points Say the months of the year in reverse: 0 points Repeat the address phrase from earlier: 0 points 6 CIT Score: 0 points  Advance Directives (For Healthcare) Does Patient Have a Medical Advance Directive?: Yes Does patient want to make changes to medical advance directive?: No - Patient declined Type of Advance Directive: Healthcare Power of [place]; Living will Copy of Healthcare Power of Attorney in Chart?: Yes - validated most recent copy scanned in chart (See row information) Copy of Living Will in  Chart?: Yes - validated most recent copy scanned in chart (See row information)  Reviewed/Updated  Reviewed/Updated:  Reviewed All (Medical, Surgical, Family, Medications, Allergies, Care Teams, Patient Goals)    Allergies (verified) Patient has no known allergies.   Current Medications (verified) Outpatient Encounter Medications as of 09/02/2024  Medication Sig   atorvastatin  (LIPITOR) 20 MG tablet Take 1 tablet (20 mg total) by mouth daily.   Cholecalciferol (VITAMIN D3) 50 MCG (2000 UT) capsule Take one po every day   Multiple Vitamins-Minerals (EYE MULTIVITAMIN/LUTEIN) CAPS Take 2 capsules by mouth daily.   vitamin B-12 (CYANOCOBALAMIN ) 100 MCG tablet Take 100 mcg by mouth daily.   No facility-administered encounter medications on file as of 09/02/2024.    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    Arthritis    Irregular heart rate    Long toenail    Restlessness    Skin lesions    Skin lesions    Ventricular ectopic beat    Vitreous degeneration, left eye    Past Surgical History:  Procedure Laterality Date   APPENDECTOMY     DENTAL SURGERY     teeth removed, now with dentures   eye surgery     macular degeneration 2019 laser surgery right eye   EYE SURGERY     FRACTURE SURGERY  1963   broken collar bone   VASECTOMY     Family History  Problem Relation Age of Onset   Lung cancer Mother    Breast cancer Mother    Arthritis Mother    Cancer Mother    COPD Mother    Vision loss Mother    Varicose Veins Mother    Healthy Father    Alcohol abuse Brother    Anxiety disorder Brother    Asthma Brother    Drug abuse Brother    Bipolar disorder Brother    Diabetes Maternal Grandmother        bil LE amputee   Lung cancer Maternal Grandfather        smoker   Alzheimer's disease Paternal  Grandmother    Heart failure Paternal Grandfather    Kidney disease Maternal Uncle    Social History   Occupational History   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  Tobacco Use   Smoking status: Former    Types: E-cigarettes   Smokeless tobacco: Never   Tobacco comments:    Currently Vapingl, no cigarettes - DJM 07/08/2023  Vaping Use   Vaping status: Every Day   Start date: 05/25/2013   Substances: Nicotine  Substance and Sexual Activity   Alcohol use: Not Currently    Comment: rarerly six times a year   Drug use: Never   Sexual activity: Not Currently    Partners: Female   Tobacco Counseling Counseling given: No Tobacco comments: Currently Vapingl, no cigarettes - DJM 07/08/2023  SDOH Screenings   Food Insecurity: No Food Insecurity (08/29/2024)  Housing: Low Risk (08/29/2024)  Transportation Needs: No Transportation Needs (08/29/2024)  Utilities: Not At Risk (09/02/2024)  Alcohol Screen: Low Risk (08/29/2024)  Depression (PHQ2-9): Low Risk (09/02/2024)  Financial Resource Strain: Low Risk (08/29/2024)  Physical Activity: Sufficiently Active (08/29/2024)  Social Connections: Socially Isolated (09/02/2024)  Stress: No Stress Concern Present (09/02/2024)  Tobacco Use: Medium Risk (09/02/2024)  Health Literacy: Adequate Health Literacy (09/02/2024)   See flowsheets for full screening details  Depression Screen PHQ 2 & 9 Depression Scale- Over  the past 2 weeks, how often have you been bothered by any of the following problems? Little interest or pleasure in doing things: 0 Feeling down, depressed, or hopeless (PHQ Adolescent also includes...irritable): 0 PHQ-2 Total Score: 0     Goals Addressed             This Visit's Progress    i would like to work on getting my knees better               Objective:    Today's Vitals   09/02/24 1054  BP: 122/78  Pulse: 66  Weight: 214 lb (97.1 kg)  Height: 5' 11 (1.803 m)   Body mass index is 29.85  kg/m.  Hearing/Vision screen Vision Screening - Comments:: UTD w/ Dr Arlyss King Immunizations and Health Maintenance Health Maintenance  Topic Date Due   COVID-19 Vaccine (1) Never done   Hepatitis C Screening  Never done   Zoster Vaccines- Shingrix (1 of 2) Never done   Colonoscopy  Never done   Medicare Annual Wellness (AWV)  09/01/2024   Lung Cancer Screening  07/05/2025   DTaP/Tdap/Td (2 - Td or Tdap) 03/25/2029   Pneumococcal Vaccine: 50+ Years  Completed   Influenza Vaccine  Completed   Meningococcal B Vaccine  Aged Out        Assessment/Plan:  This is a routine wellness examination for Thorin.  Patient Care Team: Corwin Antu, FNP as PCP - General (Family Medicine) King Arlyss, MD as Consulting Physician (Ophthalmology) Ladona Heinz, MD as Consulting Physician (Cardiology) Tobie Franky SQUIBB, DPM as Consulting Physician (Podiatry)  I have personally reviewed and noted the following in the patients chart:   Medical and social history Use of alcohol, tobacco or illicit drugs  Current medications and supplements including opioid prescriptions. Functional ability and status Nutritional status Physical activity Advanced directives List of other physicians Hospitalizations, surgeries, and ER visits in previous 12 months Vitals Screenings to include cognitive, depression, and falls Referrals and appointments  No orders of the defined types were placed in this encounter.  In addition, I have reviewed and discussed with patient certain preventive protocols, quality metrics, and best practice recommendations. A written personalized care plan for preventive services as well as general preventive health recommendations were provided to patient.   Erminio LITTIE Saris, LPN   [id number]    After Visit Summary: (MyChart) Due to this being a telephonic visit, the after visit summary with patients personalized plan was offered to patient via MyChart   Nurse Notes: No voiced  or noted concerns at this time HM Addressed: Pt declines colonscopy, zoster, Covid vaccines  "

## 2024-09-02 NOTE — Patient Instructions (Signed)
 Isaiah Williams,  Thank you for taking the time for your Medicare Wellness Visit. I appreciate your continued commitment to your health goals. Please review the care plan we discussed, and feel free to reach out if I can assist you further.  Please note that Annual Wellness Visits do not include a physical exam. Some assessments may be limited, especially if the visit was conducted virtually. If needed, we may recommend an in-person follow-up with your provider.  Ongoing Care Seeing your primary care provider every 3 to 6 months helps us  monitor your health and provide consistent, personalized care.   Referrals If a referral was made during today's visit and you haven't received any updates within two weeks, please contact the referred provider directly to check on the status.  Recommended Screenings:  Health Maintenance  Topic Date Due   COVID-19 Vaccine (1) Never done   Hepatitis C Screening  Never done   Zoster (Shingles) Vaccine (1 of 2) Never done   Colon Cancer Screening  Never done   Medicare Annual Wellness Visit  09/01/2024   Screening for Lung Cancer  07/05/2025   DTaP/Tdap/Td vaccine (2 - Td or Tdap) 03/25/2029   Pneumococcal Vaccine for age over 67  Completed   Flu Shot  Completed   Meningitis B Vaccine  Aged Out       08/29/2024   10:49 AM  Advanced Directives  Does Patient Have a Medical Advance Directive? Yes  Type of Estate Agent of [place];Living will  Does patient want to make changes to medical advance directive? No - Patient declined  Copy of Healthcare Power of Attorney in Chart? Yes - validated most recent copy scanned in chart (See row information)    Vision: Annual vision screenings are recommended for early detection of glaucoma, cataracts, and diabetic retinopathy. These exams can also reveal signs of chronic conditions such as diabetes and high blood pressure.  Dental: Annual dental screenings help detect early signs of oral  cancer, gum disease, and other conditions linked to overall health, including heart disease and diabetes.

## 2025-05-29 ENCOUNTER — Encounter: Admitting: Family
